# Patient Record
Sex: Female | Born: 1990 | Hispanic: No | Marital: Married | Smoking: Never smoker
Health system: Southern US, Community
[De-identification: ages and names within clinical notes are randomized; demographics above are authoritative.]

## PROBLEM LIST (undated history)

## (undated) DIAGNOSIS — E282 Polycystic ovarian syndrome: Secondary | ICD-10-CM

## (undated) DIAGNOSIS — J302 Other seasonal allergic rhinitis: Secondary | ICD-10-CM

## (undated) HISTORY — DX: Other seasonal allergic rhinitis: J30.2

## (undated) HISTORY — DX: Polycystic ovarian syndrome: E28.2

## (undated) HISTORY — PX: NO PAST SURGERIES: SHX2092

---

## 2014-12-27 ENCOUNTER — Other Ambulatory Visit (HOSPITAL_COMMUNITY)
Admission: RE | Admit: 2014-12-27 | Discharge: 2014-12-27 | Disposition: A | Discharge status: Home / Self Care | Payer: BLUE CROSS/BLUE SHIELD | Source: Ambulatory Visit | Attending: Obstetrics and Gynecology | Admitting: Obstetrics and Gynecology

## 2014-12-27 DIAGNOSIS — Z01419 Encounter for gynecological examination (general) (routine) without abnormal findings: Secondary | ICD-10-CM | POA: Diagnosis not present

## 2015-09-02 DIAGNOSIS — J3089 Other allergic rhinitis: Secondary | ICD-10-CM | POA: Insufficient documentation

## 2015-09-02 DIAGNOSIS — J302 Other seasonal allergic rhinitis: Secondary | ICD-10-CM | POA: Insufficient documentation

## 2017-05-04 ENCOUNTER — Other Ambulatory Visit: Payer: Self-pay | Admitting: Nurse Practitioner

## 2017-05-04 ENCOUNTER — Other Ambulatory Visit (HOSPITAL_COMMUNITY)
Admission: RE | Admit: 2017-05-04 | Discharge: 2017-05-04 | Disposition: A | Discharge status: Home / Self Care | Payer: BLUE CROSS/BLUE SHIELD | Source: Ambulatory Visit | Attending: Nurse Practitioner | Admitting: Nurse Practitioner

## 2017-05-04 DIAGNOSIS — Z01419 Encounter for gynecological examination (general) (routine) without abnormal findings: Secondary | ICD-10-CM | POA: Insufficient documentation

## 2017-05-06 LAB — CYTOLOGY - PAP
CHLAMYDIA, DNA PROBE: NEGATIVE
Diagnosis: NEGATIVE
Neisseria Gonorrhea: NEGATIVE

## 2018-06-14 DIAGNOSIS — E282 Polycystic ovarian syndrome: Secondary | ICD-10-CM | POA: Insufficient documentation

## 2019-04-24 DIAGNOSIS — N970 Female infertility associated with anovulation: Secondary | ICD-10-CM | POA: Insufficient documentation

## 2019-06-08 DIAGNOSIS — L309 Dermatitis, unspecified: Secondary | ICD-10-CM | POA: Insufficient documentation

## 2019-06-08 DIAGNOSIS — L219 Seborrheic dermatitis, unspecified: Secondary | ICD-10-CM | POA: Insufficient documentation

## 2019-06-30 ENCOUNTER — Ambulatory Visit: Payer: Medicaid Other | Attending: Internal Medicine

## 2019-06-30 DIAGNOSIS — Z23 Encounter for immunization: Secondary | ICD-10-CM

## 2019-06-30 NOTE — Progress Notes (Signed)
   Covid-19 Vaccination Clinic  Name:  Abigail Berry    MRN: [id number] DOB: 11-17-1990  06/30/2019  Abigail Berry was observed post Covid-19 immunization for 15 minutes without incident. She was provided with Vaccine Information Sheet and instruction to access the V-Safe system.   Abigail Berry was instructed to call 911 with any severe reactions post vaccine: Marland Kitchen Difficulty breathing  . Swelling of face and throat  . A fast heartbeat  . A bad rash all over body  . Dizziness and weakness   Immunizations Administered    Name Date Dose VIS Date Route   Pfizer COVID-19 Vaccine 06/30/2019  9:21 AM 0.3 mL 03/24/2019 Intramuscular   Manufacturer: [employer], [employer]   Lot: [id number]   NDC: [address]-6

## 2019-07-25 ENCOUNTER — Ambulatory Visit: Payer: Medicaid Other | Attending: Internal Medicine

## 2019-07-25 DIAGNOSIS — Z23 Encounter for immunization: Secondary | ICD-10-CM

## 2019-07-25 NOTE — Progress Notes (Signed)
   Covid-19 Vaccination Clinic  Name:  Abigail Berry    MRN: [id number] DOB: 1991/02/02  07/25/2019  Abigail Berry was observed post Covid-19 immunization for 15 minutes without incident. She was provided with Vaccine Information Sheet and instruction to access the V-Safe system.   Abigail Berry was instructed to call 911 with any severe reactions post vaccine: Marland Kitchen Difficulty breathing  . Swelling of face and throat  . A fast heartbeat  . A bad rash all over body  . Dizziness and weakness   Immunizations Administered    Name Date Dose VIS Date Route   Pfizer COVID-19 Vaccine 07/25/2019 11:33 AM 0.3 mL 03/24/2019 Intramuscular   Manufacturer: [employer], [employer]   Lot: [id number]   NDC: [address]-3

## 2020-05-28 LAB — OB RESULTS CONSOLE GC/CHLAMYDIA
Chlamydia: NEGATIVE
Gonorrhea: NEGATIVE

## 2020-05-28 LAB — OB RESULTS CONSOLE HIV ANTIBODY (ROUTINE TESTING): HIV: NONREACTIVE

## 2020-05-28 LAB — OB RESULTS CONSOLE RUBELLA ANTIBODY, IGM: Rubella: IMMUNE

## 2020-05-28 LAB — OB RESULTS CONSOLE ABO/RH: RH Type: NEGATIVE

## 2020-05-28 LAB — OB RESULTS CONSOLE ANTIBODY SCREEN: Antibody Screen: NEGATIVE

## 2020-05-28 LAB — OB RESULTS CONSOLE RPR: RPR: NONREACTIVE

## 2020-05-28 LAB — OB RESULTS CONSOLE HEPATITIS B SURFACE ANTIGEN: Hepatitis B Surface Ag: NEGATIVE

## 2020-06-05 ENCOUNTER — Ambulatory Visit (INDEPENDENT_AMBULATORY_CARE_PROVIDER_SITE_OTHER): Payer: Medicaid Other | Admitting: Women's Health

## 2020-06-05 ENCOUNTER — Encounter: Payer: Self-pay | Admitting: Women's Health

## 2020-06-05 ENCOUNTER — Other Ambulatory Visit (HOSPITAL_COMMUNITY)
Admission: RE | Admit: 2020-06-05 | Discharge: 2020-06-05 | Disposition: A | Discharge status: Home / Self Care | Payer: Medicaid Other | Source: Ambulatory Visit | Attending: Women's Health | Admitting: Women's Health

## 2020-06-05 ENCOUNTER — Other Ambulatory Visit: Payer: Self-pay

## 2020-06-05 VITALS — BP 132/74 | HR 99 | Ht 65.0 in | Wt 179.0 lb

## 2020-06-05 DIAGNOSIS — O26899 Other specified pregnancy related conditions, unspecified trimester: Secondary | ICD-10-CM

## 2020-06-05 DIAGNOSIS — Z3401 Encounter for supervision of normal first pregnancy, first trimester: Secondary | ICD-10-CM

## 2020-06-05 DIAGNOSIS — O99011 Anemia complicating pregnancy, first trimester: Secondary | ICD-10-CM

## 2020-06-05 DIAGNOSIS — Z6791 Unspecified blood type, Rh negative: Secondary | ICD-10-CM

## 2020-06-05 DIAGNOSIS — Z3A13 13 weeks gestation of pregnancy: Secondary | ICD-10-CM

## 2020-06-05 MED ORDER — ASPIRIN EC 81 MG PO TBEC: 81.0000 mg | DELAYED_RELEASE_TABLET | Freq: Every day | ORAL | 6 refills | Status: DC

## 2020-06-05 NOTE — Patient Instructions (Addendum)
Maternity Assessment Unit (MAU)  The Maternity Assessment Unit (MAU) is located at the [facility] at [facility]. The address is: [address] [place], [place], [place] [address]. Please see map below for additional directions.    The Maternity Assessment Unit is designed to help you during your pregnancy, and for up to 6 weeks after delivery, with any pregnancy- or postpartum-related emergencies, if you think you are in labor, or if your water has broken. For example, if you experience nausea and vomiting, vaginal bleeding, severe abdominal or pelvic pain, elevated blood pressure or other problems related to your pregnancy or postpartum time, please come to the Maternity Assessment Unit for assistance.                        Safe Medications in Pregnancy    Acne: Benzoyl Peroxide Salicylic Acid  Backache/Headache: Tylenol: 2 regular strength every 4 hours OR              2 Extra strength every 6 hours  Colds/Coughs/Allergies: Benadryl (alcohol free) 25 mg every 6 hours as needed Breath right strips Claritin Cepacol throat lozenges Chloraseptic throat spray Cold-Eeze- up to three times per day Cough drops, alcohol free Flonase (by prescription only) Guaifenesin Mucinex Robitussin DM (plain only, alcohol free) Saline nasal spray/drops Sudafed (pseudoephedrine) & Actifed ** use only after [redacted] weeks gestation and if you do not have high blood pressure Tylenol Vicks Vaporub Zinc lozenges Zyrtec   Constipation: Colace Ducolax suppositories Fleet enema Glycerin suppositories Metamucil Milk of magnesia Miralax Senokot Smooth move tea  Diarrhea: Kaopectate Imodium A-D  *NO pepto Bismol  Hemorrhoids: Anusol Anusol HC Preparation H Tucks  Indigestion: Tums Maalox Mylanta Zantac  Pepcid  Insomnia: Benadryl (alcohol free)  every 6 hours as needed Tylenol PM Unisom, no Gelcaps  Leg  Cramps: Tums MagGel  Nausea/Vomiting:  Bonine Dramamine Emetrol Ginger extract Sea bands Meclizine  Nausea medication to take during pregnancy:  Unisom (doxylamine succinate 25 mg tablets) Take one tablet daily at bedtime. If symptoms are not adequately controlled, the dose can be increased to a maximum recommended dose of two tablets daily (1/2 tablet in the morning, 1/2 tablet mid-afternoon and one at bedtime). Vitamin B6  tablets. Take one tablet twice a day (up to 200 mg per day).  Skin Rashes: Aveeno products Benadryl cream or  every 6 hours as needed Calamine Lotion 1% cortisone cream  Yeast infection: Gyne-lotrimin 7 Monistat 7   **If taking multiple medications, please check labels to avoid duplicating the same active ingredients **take medication as directed on the label ** Do not exceed 4000 mg of tylenol in 24 hours **Do not take medications that contain aspirin or ibuprofen           Second Trimester of Pregnancy  The second trimester of pregnancy is from week 13 through week 27. This is months 4 through 6 of pregnancy. The second trimester is often a time when you feel your best. Your body has adjusted to being pregnant, and you begin to feel better physically. During the second trimester:  Morning sickness has lessened or stopped completely.  You may have more energy.  You may have an increase in appetite. The second trimester is also a time when the unborn baby (fetus) is growing rapidly. At the end of the sixth month, the fetus may be up to 12 inches long and weigh about 1 pounds. You will likely begin to  feel the baby move (quickening) between 16 and 20 weeks of pregnancy. Body changes during your second trimester Your body continues to go through many changes during your second trimester. The changes vary and generally return to normal after the baby is born. Physical changes  Your weight will continue to increase. You will notice  your lower abdomen bulging out.  You may begin to get stretch marks on your hips, abdomen, and breasts.  Your breasts will continue to grow and to become tender.  Dark spots or blotches (chloasma or mask of pregnancy) may develop on your face.  A dark line from your belly button to the pubic area (linea nigra) may appear.  You may have changes in your hair. These can include thickening of your hair, rapid growth, and changes in texture. Some people also have hair loss during or after pregnancy, or hair that feels dry or thin. Health changes  You may develop headaches.  You may have heartburn.  You may develop constipation.  You may develop hemorrhoids or swollen, bulging veins (varicose veins).  Your gums may bleed and may be sensitive to brushing and flossing.  You may urinate more often because the fetus is pressing on your bladder.  You may have back pain. This is caused by: ? Weight gain. ? Pregnancy hormones that are relaxing the joints in your pelvis. ? A shift in weight and the muscles that support your balance. Follow these instructions at home: Medicines  Follow your health care provider's instructions regarding medicine use. Specific medicines may be either safe or unsafe to take during pregnancy. Do not take any medicines unless approved by your health care provider.  Take a prenatal vitamin that contains at least 600 micrograms (mcg) of folic acid. Eating and drinking  Eat a healthy diet that includes fresh fruits and vegetables, whole grains, good sources of protein such as meat, eggs, or tofu, and low-fat dairy products.  Avoid raw meat and unpasteurized juice, milk, and cheese. These carry germs that can harm you and your baby.  You may need to take these actions to prevent or treat constipation: ? Drink enough fluid to keep your urine pale yellow. ? Eat foods that are high in fiber, such as beans, whole grains, and fresh fruits and vegetables. ? Limit foods  that are high in fat and processed sugars, such as fried or sweet foods. Activity  Exercise only as directed by your health care provider. Most people can continue their usual exercise routine during pregnancy. Try to exercise for 30 minutes at least 5 days a week. Stop exercising if you develop contractions in your uterus.  Stop exercising if you develop pain or cramping in the lower abdomen or lower back.  Avoid exercising if it is very hot or humid or if you are at a high altitude.  Avoid heavy lifting.  If you choose to, you may have sex unless your health care provider tells you not to. Relieving pain and discomfort  Wear a supportive bra to prevent discomfort from breast tenderness.  Take warm sitz baths to soothe any pain or discomfort caused by hemorrhoids. Use hemorrhoid cream if your health care provider approves.  Rest with your legs raised (elevated) if you have leg cramps or low back pain.  If you develop varicose veins: ? Wear support hose as told by your health care provider. ? Elevate your feet for 15 minutes, 3-4 times a day. ? Limit salt in your diet. Safety  Wear your  seat belt at all times when driving or riding in a car.  Talk with your health care provider if someone is verbally or physically abusive to you. Lifestyle  Do not use hot tubs, steam rooms, or saunas.  Do not douche. Do not use tampons or scented sanitary pads.  Avoid cat litter boxes and soil used by cats. These carry germs that can cause birth defects in the baby and possibly loss of the fetus by miscarriage or stillbirth.  Do not use herbal remedies, alcohol, illegal drugs, or medicines that are not approved by your health care provider. Chemicals in these products can harm your baby.  Do not use any products that contain nicotine or tobacco, such as cigarettes, e-cigarettes, and chewing tobacco. If you need help quitting, ask your health care provider. General instructions  During a  routine prenatal visit, your health care provider will do a physical exam and other tests. He or she will also discuss your overall health. Keep all follow-up visits. This is important.  Ask your health care provider for a referral to a local prenatal education class.  Ask for help if you have counseling or nutritional needs during pregnancy. Your health care provider can offer advice or refer you to specialists for help with various needs. Where to find more information  American Pregnancy Association: americanpregnancy.org  [employer] of Obstetricians and Gynecologists: https://www.todd-brady.net/  Office on [employer] Health: MightyReward.co.nz Contact a health care provider if you have:  A headache that does not go away when you take medicine.  Vision changes or you see spots in front of your eyes.  Mild pelvic cramps, pelvic pressure, or nagging pain in the abdominal area.  Persistent nausea, vomiting, or diarrhea.  A bad-smelling vaginal discharge or foul-smelling urine.  Pain when you urinate.  Sudden or extreme swelling of your face, hands, ankles, feet, or legs.  A fever. Get help right away if you:  Have fluid leaking from your vagina.  Have spotting or bleeding from your vagina.  Have severe abdominal cramping or pain.  Have difficulty breathing.  Have chest pain.  Have fainting spells.  Have not felt your baby move for the time period told by your health care provider.  Have new or increased pain, swelling, or redness in an arm or leg. Summary  The second trimester of pregnancy is from week 13 through week 27 (months 4 through 6).  Do not use herbal remedies, alcohol, illegal drugs, or medicines that are not approved by your health care provider. Chemicals in these products can harm your baby.  Exercise only as directed by your health care provider. Most people can continue their usual exercise routine during pregnancy.  Keep all  follow-up visits. This is important. This information is not intended to replace advice given to you by your health care provider. Make sure you discuss any questions you have with your health care provider. Document Revised: 09/06/2019 Document Reviewed: 07/13/2019 Elsevier Patient Education  2021 Elsevier Inc.        Round Ligament Pain  The round ligament is a cord of muscle and tissue that helps support the uterus. It can become a source of pain during pregnancy if it becomes stretched or twisted as the baby grows. The pain usually begins in the second trimester (13-28 weeks) of pregnancy, and it can come and go until the baby is delivered. It is not a serious problem, and it does not cause harm to the baby. Round ligament pain is usually  a short, sharp, and pinching pain, but it can also be a dull, lingering, and aching pain. The pain is felt in the lower side of the abdomen or in the groin. It usually starts deep in the groin and moves up to the outside of the hip area. The pain may occur when you:  Suddenly change position, such as quickly going from a sitting to standing position.  Roll over in bed.  Cough or sneeze.  Do physical activity. Follow these instructions at home:  Watch your condition for any changes.  When the pain starts, relax. Then try any of these methods to help with the pain: ? Sitting down. ? Flexing your knees up to your abdomen. ? Lying on your side with one pillow under your abdomen and another pillow between your legs. ? Sitting in a warm bath for 15-20 minutes or until the pain goes away.  Take over-the-counter and prescription medicines only as told by your health care provider.  Move slowly when you sit down or stand up.  Avoid long walks if they cause pain.  Stop or reduce your physical activities if they cause pain.  Keep all follow-up visits as told by your health care provider. This is important.   Contact a health care provider  if:  Your pain does not go away with treatment.  You feel pain in your back that you did not have before.  Your medicine is not helping. Get help right away if:  You have a fever or chills.  You develop uterine contractions.  You have vaginal bleeding.  You have nausea or vomiting.  You have diarrhea.  You have pain when you urinate. Summary  Round ligament pain is felt in the lower abdomen or groin. It is usually a short, sharp, and pinching pain. It can also be a dull, lingering, and aching pain.  This pain usually begins in the second trimester (13-28 weeks). It occurs because the uterus is stretching with the growing baby, and it is not harmful to the baby.  You may notice the pain when you suddenly change position, when you cough or sneeze, or during physical activity.  Relaxing, flexing your knees to your abdomen, lying on one side, or taking a warm bath may help to get rid of the pain.  Get help from your health care provider if the pain does not go away or if you have vaginal bleeding, nausea, vomiting, diarrhea, or painful urination. This information is not intended to replace advice given to you by your health care provider. Make sure you discuss any questions you have with your health care provider. Document Revised: 09/15/2017 Document Reviewed: 09/15/2017 Elsevier Patient Education  2021 Elsevier Inc.                       Safe Medications in Pregnancy    Acne: Benzoyl Peroxide Salicylic Acid  Backache/Headache: Tylenol: 2 regular strength every 4 hours OR              2 Extra strength every 6 hours  Colds/Coughs/Allergies: Benadryl (alcohol free) 25 mg every 6 hours as needed Breath right strips Claritin Cepacol throat lozenges Chloraseptic throat spray Cold-Eeze- up to three times per day Cough drops, alcohol free Flonase (by prescription only) Guaifenesin Mucinex Robitussin DM (plain only, alcohol free) Saline nasal spray/drops Sudafed  (pseudoephedrine) & Actifed ** use only after [redacted] weeks gestation and if you do not have high blood pressure Tylenol Vicks Vaporub Zinc  lozenges Zyrtec   Constipation: Colace Ducolax suppositories Fleet enema Glycerin suppositories Metamucil Milk of magnesia Miralax Senokot Smooth move tea  Diarrhea: Kaopectate Imodium A-D  *NO pepto Bismol  Hemorrhoids: Anusol Anusol HC Preparation H Tucks  Indigestion: Tums Maalox Mylanta Zantac  Pepcid  Insomnia: Benadryl (alcohol free)  every 6 hours as needed Tylenol PM Unisom, no Gelcaps  Leg Cramps: Tums MagGel  Nausea/Vomiting:  Bonine Dramamine Emetrol Ginger extract Sea bands Meclizine  Nausea medication to take during pregnancy:  Unisom (doxylamine succinate 25 mg tablets) Take one tablet daily at bedtime. If symptoms are not adequately controlled, the dose can be increased to a maximum recommended dose of two tablets daily (1/2 tablet in the morning, 1/2 tablet mid-afternoon and one at bedtime). Vitamin B6  tablets. Take one tablet twice a day (up to 200 mg per day).  Skin Rashes: Aveeno products Benadryl cream or  every 6 hours as needed Calamine Lotion 1% cortisone cream  Yeast infection: Gyne-lotrimin 7 Monistat 7   **If taking multiple medications, please check labels to avoid duplicating the same active ingredients **take medication as directed on the label ** Do not exceed 4000 mg of tylenol in 24 hours **Do not take medications that contain aspirin or ibuprofen

## 2020-06-05 NOTE — Addendum Note (Signed)
Addended by: Marya Landry D on: 06/05/2020 11:35 AM   Modules accepted: Orders

## 2020-06-05 NOTE — Progress Notes (Signed)
History:   Abigail Berry is a 30 y.o. No obstetric history on file. at 30w4dby LMP being seen today for her first obstetrical visit.  Her obstetrical history is significant for none. Patient does intend to breast feed. Pregnancy history fully reviewed.  Pt reports this is a desired and planned pregnancy. Allergies: NKDA Current Medications: PNVs, VitD, Claritin PMH: none, pt reports hx of pre-diabetes. No HTN, DM, asthma. PSH: none OB Hx: none Social Hx: pt does not smoke, drink, or use drugs. Family Hx: Down Syndrome in father's cousin, cleft palate Pt had flu vaccine 03/2020.  Patient reports no complaints.      HISTORY: OB History  Gravida Para Term Preterm AB Living  1 0 0 0 0 0  SAB IAB Ectopic Multiple Live Births  0 0 0 0 0    # Outcome Date GA Lbr Len/2nd Weight Sex Delivery Anes PTL Lv  1 Current             Last pap smear was done 04/2017 and was normal.  Past Medical History:  Diagnosis Date  . PCOS (polycystic ovarian syndrome)   . Seasonal allergies    Past Surgical History:  Procedure Laterality Date  . NO PAST SURGERIES     Family History  Problem Relation Age of Onset  . Miscarriages / [place] Mother   . Hypertension Father   . Miscarriages / Stillbirths Maternal Aunt   . Diabetes Maternal Grandmother   . Diabetes Maternal Grandfather   . Hypertension Maternal Grandfather   . Diabetes Paternal Grandmother   . Diabetes Paternal Grandfather    Social History   Tobacco Use  . Smoking status: Never Smoker  . Smokeless tobacco: Never Used  Vaping Use  . Vaping Use: Never used  Substance Use Topics  . Alcohol use: Not Currently  . Drug use: Not Currently   Not on File Current Outpatient Medications on File Prior to Visit  Medication Sig Dispense Refill  . cholecalciferol (VITAMIN D3) 25 MCG (1000 UNIT) tablet Take 1,000 Units by mouth daily.    Marland Kitchen loratadine-pseudoephedrine (CLARITIN-D 12-HOUR) 5-120 MG tablet Take 1 tablet by mouth  2 (two) times daily.    . prenatal vitamin w/FE, FA (PRENATAL 1 + 1) 27-1 MG TABS tablet Take 1 tablet by mouth daily at 12 noon.     No current facility-administered medications on file prior to visit.    Review of Systems Pertinent items noted in HPI and remainder of comprehensive ROS otherwise negative.  Physical Exam:   Vitals:   06/05/20 0840 06/05/20 0843  BP: 132/74   Pulse: 99   Weight: 179 lb (81.2 kg)   Height:  5\' 5"  (1.651 m)     Bedside Ultrasound for FHR check: Viable intrauterine pregnancy with positive cardiac activity noted, fetal heart rate 140sbpm Patient informed that the ultrasound is considered a limited obstetric ultrasound and is not intended to be a complete ultrasound exam.  Patient also informed that the ultrasound is not being completed with the intent of assessing for fetal or placental anomalies or any pelvic abnormalities.  Explained that the purpose of today's ultrasound is to assess for fetal heart rate.  Patient acknowledges the purpose of the exam and the limitations of the study. General: well-developed, well-nourished female in no acute distress  Breasts:  normal appearance, no masses or tenderness bilaterally  Skin: normal coloration and turgor, no rashes  Neurologic: oriented, normal, negative, normal mood  Extremities: normal strength, tone, and muscle  mass, ROM of all joints is normal  HEENT PERRLA, extraocular movement intact and sclera clear, anicteric  Neck supple and no masses  Cardiovascular: regular rate and rhythm  Respiratory:  no respiratory distress, normal breath sounds  Abdomen: soft, non-tender; bowel sounds normal; no masses,  no organomegaly  Pelvic: normal external genitalia, no lesions. Pap deferred - pt reports had one last year, wil lrequest records      Assessment:    Pregnancy: No obstetric history on file. Patient Active Problem List   Diagnosis Date Noted  . Encounter for supervision of normal first pregnancy in  first trimester 06/05/2020     Plan:    1. [redacted] weeks gestation of pregnancy  2. Encounter for supervision of normal first pregnancy in first trimester - Cytology - PAP( [place]) - Cervicovaginal ancillary only( [place]) - RPR+HBsAg+HCVAb+... - Genetic Screening - Culture, OB Urine - [place] MFM OB COMP + 14 WK; Future - HgB A1c - aspirin EC 81 MG tablet; Take 1 tablet (81 mg total) by mouth daily. Swallow whole.  Dispense: 30 tablet; Refill: 6  Initial labs drawn. Continue prenatal vitamins. Problem list reviewed and updated. Genetic Screening discussed, NIPS: undecided. Ultrasound discussed; fetal anatomic survey: ordered. Anticipatory guidance about prenatal visits given including labs, ultrasounds, and testing. Discussed usage of Babyscripts and virtual visits as additional source of managing and completing prenatal visits in midst of coronavirus and pandemic.   Encouraged to complete MyChart Registration for her ability to review results, send requests, and have questions addressed.   The nature of [facility] with multiple MDs and Advanced Practice Providers was explained to patient; also emphasized that residents, students are part of our team. Routine obstetric precautions reviewed. Encouraged to seek out care at office or emergency room [facility] preferred) for urgent and/or emergent concerns. Return in about 4 weeks (around 07/03/2020) for in-person LOB/APP OK, needs records release for Pap, needs anatomy US scheduled.     Marylen Ponto, NP  9:33 AM 06/05/2020

## 2020-06-06 LAB — CBC/D/PLT+RPR+RH+ABO+RUB AB...
Antibody Screen: NEGATIVE
Basophils Absolute: 0 10*3/uL (ref 0.0–0.2)
Basos: 0 %
EOS (ABSOLUTE): 0.2 10*3/uL (ref 0.0–0.4)
Eos: 2 %
HCV Ab: <0.1 s/co ratio (ref 0.0–0.9)
HIV Screen 4th Generation wRfx: NONREACTIVE
Hematocrit: 31.4 % — ABNORMAL LOW (ref 34.0–46.6)
Hemoglobin: 10 g/dL — ABNORMAL LOW (ref 11.1–15.9)
Hepatitis B Surface Ag: NEGATIVE
Immature Grans (Abs): 0.1 10*3/uL (ref 0.0–0.1)
Immature Granulocytes: 1 %
Lymphocytes Absolute: 2 10*3/uL (ref 0.7–3.1)
Lymphs: 18 %
MCH: 23.5 pg — ABNORMAL LOW (ref 26.6–33.0)
MCHC: 31.8 g/dL (ref 31.5–35.7)
MCV: 74 fL — ABNORMAL LOW (ref 79–97)
Monocytes Absolute: 0.6 10*3/uL (ref 0.1–0.9)
Monocytes: 5 %
Neutrophils Absolute: 8.1 10*3/uL — ABNORMAL HIGH (ref 1.4–7.0)
Neutrophils: 74 %
Platelets: 334 10*3/uL (ref 150–450)
RBC: 4.25 x10E6/uL (ref 3.77–5.28)
RDW: 18.2 % — ABNORMAL HIGH (ref 11.7–15.4)
RPR Ser Ql: NONREACTIVE
Rh Factor: NEGATIVE
Rubella Antibodies, IGG: 2.32 index (ref >0.99)
WBC: 11 10*3/uL — ABNORMAL HIGH (ref 3.4–10.8)

## 2020-06-06 LAB — CERVICOVAGINAL ANCILLARY ONLY
Chlamydia: NEGATIVE
Comment: NEGATIVE
Comment: NEGATIVE
Comment: NORMAL
Neisseria Gonorrhea: NEGATIVE
Trichomonas: NEGATIVE

## 2020-06-06 LAB — HEMOGLOBIN A1C
Est. average glucose Bld gHb Est-mCnc: 108 mg/dL
Hgb A1c MFr Bld: 5.4 % (ref 4.8–5.6)

## 2020-06-06 LAB — HCV INTERPRETATION

## 2020-06-07 ENCOUNTER — Other Ambulatory Visit: Payer: Self-pay | Admitting: Women's Health

## 2020-06-07 DIAGNOSIS — O99011 Anemia complicating pregnancy, first trimester: Secondary | ICD-10-CM

## 2020-06-07 DIAGNOSIS — Z6791 Unspecified blood type, Rh negative: Secondary | ICD-10-CM | POA: Insufficient documentation

## 2020-06-07 DIAGNOSIS — O99019 Anemia complicating pregnancy, unspecified trimester: Secondary | ICD-10-CM | POA: Insufficient documentation

## 2020-06-07 DIAGNOSIS — O26899 Other specified pregnancy related conditions, unspecified trimester: Secondary | ICD-10-CM | POA: Insufficient documentation

## 2020-06-07 LAB — URINE CULTURE, OB REFLEX

## 2020-06-07 LAB — CULTURE, OB URINE

## 2020-06-07 MED ORDER — FERROUS SULFATE 325 (65 FE) MG PO TABS: 325.0000 mg | ORAL_TABLET | ORAL | 3 refills | Status: AC

## 2020-06-07 NOTE — Progress Notes (Signed)
RX iron.  Marylen Ponto, NP  11:59 AM 06/07/2020

## 2020-06-07 NOTE — Progress Notes (Signed)
Patient does not have MyChart. Please call to alert to anemia. Iron RX has been sent. Thank you!  Marylen Ponto, NP  11:58 AM 06/07/2020

## 2020-07-03 ENCOUNTER — Encounter: Payer: Medicaid Other | Admitting: Certified Nurse Midwife

## 2020-07-03 DIAGNOSIS — Z3401 Encounter for supervision of normal first pregnancy, first trimester: Secondary | ICD-10-CM

## 2020-07-15 ENCOUNTER — Ambulatory Visit: Payer: Medicaid Other | Attending: Obstetrics and Gynecology

## 2020-07-30 DIAGNOSIS — H60312 Diffuse otitis externa, left ear: Secondary | ICD-10-CM | POA: Insufficient documentation

## 2020-11-11 LAB — OB RESULTS CONSOLE GBS: GBS: NEGATIVE

## 2020-12-02 ENCOUNTER — Encounter (HOSPITAL_COMMUNITY): Payer: Self-pay | Admitting: *Deleted

## 2020-12-02 ENCOUNTER — Telehealth (HOSPITAL_COMMUNITY): Payer: Self-pay | Admitting: *Deleted

## 2020-12-02 NOTE — Telephone Encounter (Signed)
Preadmission screen  

## 2020-12-03 ENCOUNTER — Encounter (HOSPITAL_COMMUNITY): Payer: Self-pay | Admitting: *Deleted

## 2020-12-06 ENCOUNTER — Other Ambulatory Visit: Payer: Self-pay | Admitting: Obstetrics and Gynecology

## 2020-12-06 DIAGNOSIS — Z3490 Encounter for supervision of normal pregnancy, unspecified, unspecified trimester: Secondary | ICD-10-CM | POA: Diagnosis present

## 2020-12-06 NOTE — H&P (Addendum)
HPI: 30 y.o. G1P0 @ [redacted]w[redacted]d estimated gestational age (as dated by LMP c/w 9 week ultrasound) presents for induction of labor for term.  Leakage of fluid:  None Vaginal bleeding:  No Contractions:  No Fetal movement:  Yes  Prenatal care has been provided by Dr. Steva Ready [facility])  ROS:  Denies fevers, chills, chest pain, visual changes, SOB, RUQ/epigastric pain, N/V, dysuria, hematuria, or sudden onset/worsening bilateral LE or facial edema.  Pregnancy complicated by: Rh negative Pre-DM PCOS COVID-19 in early pregnancy Vitamin D Deficiency   Prenatal Transfer Tool  Maternal Diabetes: No Genetic Screening: Normal Maternal Ultrasounds/Referrals: Normal Fetal Ultrasounds or other Referrals:  None Maternal Substance Abuse:  No Significant Maternal Medications:  None Significant Maternal Lab Results: None and Group B Strep negative   Prenatal Labs Blood type:  A Negative Antibody screen:  Negative CBC:  H/H 11.2/33.7 Rubella: Immune RPR:  Non-reactive Hep B:  Negative Hep C:  Negative HIV:  Negative GC/CT:  Negative Glucola:  159.9 (elevated)  3h GTT normal  Immunizations: Tdap: Given prenatally Flu: Recieved  OBHx:  OB History     Gravida  1   Para      Term      Preterm      AB      Living         SAB      IAB      Ectopic      Multiple      Live Births             PMHx:  See above Meds:  PNV Allergy:  No Known Allergies SurgHx: None SocHx:   Denies Tobacco, ETOH, illicit drugs  O: LMP 03/02/2020  Gen. AAOx3, NAD CV.  RRR  Resp. CTAB, no wheezes/rales/rhonchi Abd. Gravid, soft, non-tender throughout, no rebound/guarding Extr.  Trace bilateral LE edema, no calf tenderness bilaterally SVE: closed/thick/high (8/26)  Last [place] (11/11/2020):  [redacted]w[redacted]d, EFW 3304g (88%), AAFV, posterior placenta (grade 3),cephalic   Labs: see orders  A/P:  30 y.o. G1P0 @ [redacted]w[redacted]d who presents for induction of labor.  IOL - term - Admit to L&D -  Admit labs (CBC, T&S, COVID screen) - CEFM/Toco - Diet:  Clear liquids - IVF:  LR at 125cc/hour - VTE Prophylaxis:  SCDs - GBS Status:  Negative - Presentation:  Confirm prior to IOL ([place] ordered) - Pain control:  Per patient request - Induction method:  Cytotec for cervical ripening - Anticipate SVD  Rh negative - Rhogam eval postpartum  Pre-DM - HgbA1C 5.1 on 06/25/20  COVID-19 in early pregnancy - Resolved  Vitamin D Deficiency - Medication: Vitamin D 2000iu daily  Steva Ready, DO [phone] (office)

## 2020-12-07 LAB — SARS CORONAVIRUS 2 (TAT 6-24 HRS): SARS Coronavirus 2: NEGATIVE

## 2020-12-09 ENCOUNTER — Inpatient Hospital Stay (HOSPITAL_BASED_OUTPATIENT_CLINIC_OR_DEPARTMENT_OTHER): Payer: Medicaid Other

## 2020-12-09 ENCOUNTER — Inpatient Hospital Stay (HOSPITAL_COMMUNITY)
Admission: AD | Admit: 2020-12-09 | Discharge: 2020-12-14 | DRG: 786 | Disposition: A | Discharge status: Home / Self Care | Payer: Medicaid Other | Attending: Obstetrics and Gynecology | Admitting: Obstetrics and Gynecology

## 2020-12-09 ENCOUNTER — Other Ambulatory Visit: Payer: Self-pay

## 2020-12-09 ENCOUNTER — Inpatient Hospital Stay (HOSPITAL_COMMUNITY): Payer: Medicaid Other

## 2020-12-09 ENCOUNTER — Encounter (HOSPITAL_COMMUNITY): Payer: Self-pay | Admitting: Obstetrics and Gynecology

## 2020-12-09 DIAGNOSIS — O2412 Pre-existing diabetes mellitus, type 2, in childbirth: Secondary | ICD-10-CM | POA: Diagnosis present

## 2020-12-09 DIAGNOSIS — O134 Gestational [pregnancy-induced] hypertension without significant proteinuria, complicating childbirth: Principal | ICD-10-CM | POA: Diagnosis present

## 2020-12-09 DIAGNOSIS — R000 Tachycardia, unspecified: Secondary | ICD-10-CM | POA: Diagnosis present

## 2020-12-09 DIAGNOSIS — O9902 Anemia complicating childbirth: Secondary | ICD-10-CM | POA: Diagnosis present

## 2020-12-09 DIAGNOSIS — D509 Iron deficiency anemia, unspecified: Secondary | ICD-10-CM | POA: Diagnosis present

## 2020-12-09 DIAGNOSIS — O99013 Anemia complicating pregnancy, third trimester: Secondary | ICD-10-CM | POA: Diagnosis not present

## 2020-12-09 DIAGNOSIS — O26893 Other specified pregnancy related conditions, third trimester: Secondary | ICD-10-CM | POA: Diagnosis present

## 2020-12-09 DIAGNOSIS — Z3A40 40 weeks gestation of pregnancy: Secondary | ICD-10-CM

## 2020-12-09 DIAGNOSIS — O411230 Chorioamnionitis, third trimester, not applicable or unspecified: Secondary | ICD-10-CM | POA: Diagnosis present

## 2020-12-09 DIAGNOSIS — O139 Gestational [pregnancy-induced] hypertension without significant proteinuria, unspecified trimester: Secondary | ICD-10-CM | POA: Diagnosis not present

## 2020-12-09 DIAGNOSIS — Z8616 Personal history of COVID-19: Secondary | ICD-10-CM | POA: Diagnosis not present

## 2020-12-09 DIAGNOSIS — Z98891 History of uterine scar from previous surgery: Secondary | ICD-10-CM

## 2020-12-09 DIAGNOSIS — Z3689 Encounter for other specified antenatal screening: Secondary | ICD-10-CM

## 2020-12-09 DIAGNOSIS — O360130 Maternal care for anti-D [Rh] antibodies, third trimester, not applicable or unspecified: Secondary | ICD-10-CM

## 2020-12-09 DIAGNOSIS — Z6791 Unspecified blood type, Rh negative: Secondary | ICD-10-CM

## 2020-12-09 DIAGNOSIS — O480 Post-term pregnancy: Secondary | ICD-10-CM | POA: Diagnosis not present

## 2020-12-09 DIAGNOSIS — O99892 Other specified diseases and conditions complicating childbirth: Secondary | ICD-10-CM | POA: Diagnosis present

## 2020-12-09 DIAGNOSIS — Z3490 Encounter for supervision of normal pregnancy, unspecified, unspecified trimester: Secondary | ICD-10-CM

## 2020-12-09 LAB — COMPREHENSIVE METABOLIC PANEL
ALT: 17 U/L (ref 0–44)
AST: 15 U/L (ref 15–41)
Albumin: 2.7 g/dL — ABNORMAL LOW (ref 3.5–5.0)
Alkaline Phosphatase: 219 U/L — ABNORMAL HIGH (ref 38–126)
Anion gap: 8 (ref 5–15)
BUN: 7 mg/dL (ref 6–20)
CO2: 20 mmol/L — ABNORMAL LOW (ref 22–32)
Calcium: 8.9 mg/dL (ref 8.9–10.3)
Chloride: 105 mmol/L (ref 98–111)
Creatinine, Ser: 0.49 mg/dL (ref 0.44–1.00)
GFR, Estimated: >60 mL/min (ref >60)
Glucose, Bld: 103 mg/dL — ABNORMAL HIGH (ref 70–99)
Potassium: 3.9 mmol/L (ref 3.5–5.1)
Sodium: 133 mmol/L — ABNORMAL LOW (ref 135–145)
Total Bilirubin: 0.2 mg/dL — ABNORMAL LOW (ref 0.3–1.2)
Total Protein: 6.7 g/dL (ref 6.5–8.1)

## 2020-12-09 LAB — PROTEIN / CREATININE RATIO, URINE
Creatinine, Urine: 125.61 mg/dL
Protein Creatinine Ratio: 0.14 mg/mg{Cre} (ref 0.00–0.15)
Total Protein, Urine: 18 mg/dL

## 2020-12-09 LAB — TYPE AND SCREEN
ABO/RH(D): A NEG
Antibody Screen: NEGATIVE

## 2020-12-09 LAB — CBC
HCT: 39.8 % (ref 36.0–46.0)
Hemoglobin: 13.2 g/dL (ref 12.0–15.0)
MCH: 28.4 pg (ref 26.0–34.0)
MCHC: 33.2 g/dL (ref 30.0–36.0)
MCV: 85.8 fL (ref 80.0–100.0)
Platelets: 284 10*3/uL (ref 150–400)
RBC: 4.64 MIL/uL (ref 3.87–5.11)
RDW: 15.5 % (ref 11.5–15.5)
WBC: 8.9 10*3/uL (ref 4.0–10.5)
nRBC: 0 % (ref 0.0–0.2)

## 2020-12-09 LAB — LACTATE DEHYDROGENASE: LDH: 103 U/L (ref 98–192)

## 2020-12-09 MED ORDER — TERBUTALINE SULFATE 1 MG/ML IJ SOLN
0.2500 mg | Freq: Once | INTRAMUSCULAR | Status: DC | PRN
Start: 2020-12-09 — End: 2020-12-12

## 2020-12-09 MED ORDER — OXYTOCIN-SODIUM CHLORIDE 30-0.9 UT/500ML-% IV SOLN: 1.0000 m[IU]/min | INTRAVENOUS | Status: DC

## 2020-12-09 MED ORDER — OXYTOCIN-SODIUM CHLORIDE 30-0.9 UT/500ML-% IV SOLN: 2.5000 [IU]/h | INTRAVENOUS | Status: DC

## 2020-12-09 MED ORDER — ACETAMINOPHEN 325 MG PO TABS: 650.0000 mg | ORAL_TABLET | ORAL | Status: DC | PRN

## 2020-12-09 MED ORDER — MISOPROSTOL 25 MCG QUARTER TABLET
25.0000 ug | ORAL_TABLET | ORAL | Status: DC | PRN
  Administered 2020-12-09 – 2020-12-10 (×4): 25 ug via VAGINAL
  Filled 2020-12-09 (×4): qty 1

## 2020-12-09 MED ORDER — OXYTOCIN BOLUS FROM INFUSION: 333.0000 mL | Freq: Once | INTRAVENOUS | Status: DC

## 2020-12-09 MED ORDER — FENTANYL CITRATE (PF) 100 MCG/2ML IJ SOLN
50.0000 ug | INTRAMUSCULAR | Status: DC | PRN
  Administered 2020-12-10: 50 ug via INTRAVENOUS
  Administered 2020-12-11 (×4): 100 ug via INTRAVENOUS
  Filled 2020-12-09 (×6): qty 2

## 2020-12-09 MED ORDER — LACTATED RINGERS IV SOLN: INTRAVENOUS | Status: DC

## 2020-12-09 MED ORDER — LIDOCAINE HCL (PF) 1 % IJ SOLN: 30.0000 mL | INTRAMUSCULAR | Status: DC | PRN

## 2020-12-09 MED ORDER — OXYCODONE-ACETAMINOPHEN 5-325 MG PO TABS: 1.0000 | ORAL_TABLET | ORAL | Status: DC | PRN

## 2020-12-09 MED ORDER — ONDANSETRON HCL 4 MG/2ML IJ SOLN
4.0000 mg | Freq: Four times a day (QID) | INTRAMUSCULAR | Status: DC | PRN
  Administered 2020-12-11: 4 mg via INTRAVENOUS
  Filled 2020-12-09: qty 2

## 2020-12-09 MED ORDER — SOD CITRATE-CITRIC ACID 500-334 MG/5ML PO SOLN
30.0000 mL | ORAL | Status: DC | PRN
  Administered 2020-12-12: 30 mL via ORAL
  Filled 2020-12-09: qty 30

## 2020-12-09 MED ORDER — OXYCODONE-ACETAMINOPHEN 5-325 MG PO TABS: 2.0000 | ORAL_TABLET | ORAL | Status: DC | PRN

## 2020-12-09 MED ORDER — LACTATED RINGERS IV SOLN
500.0000 mL | INTRAVENOUS | Status: DC | PRN
  Administered 2020-12-10 – 2020-12-11 (×2): 500 mL via INTRAVENOUS
  Administered 2020-12-12: 1000 mL via INTRAVENOUS

## 2020-12-09 NOTE — Progress Notes (Addendum)
OB Progress Note  S: Patient resting comfortably. She just received her second Cytotec.  O: BP (!) 104/45   Pulse 87   Temp 98.1 F (36.7 C) (Oral)   LMP 03/02/2020   FHT: 155bpm, moderate variablity, + accels, = decels Toco: NONE SVE: Deferred, closed/thick/high (per primary RN at ~1700)  A/P: 30 y.o. G1P0 @ [redacted]w[redacted]d admitted for induction of labor for term.  FWB: Cat. I Labor course: S/p Cyototec x 2, continue cervical ripening Pain: Per patient request GBS: Negative Anticipate SVD  Steva Ready, DO

## 2020-12-10 LAB — RPR: RPR Ser Ql: NONREACTIVE

## 2020-12-10 MED ORDER — LABETALOL HCL 5 MG/ML IV SOLN: 20.0000 mg | INTRAVENOUS | Status: DC | PRN

## 2020-12-10 MED ORDER — LABETALOL HCL 5 MG/ML IV SOLN: 40.0000 mg | INTRAVENOUS | Status: DC | PRN

## 2020-12-10 MED ORDER — HYDRALAZINE HCL 20 MG/ML IJ SOLN: 10.0000 mg | INTRAMUSCULAR | Status: DC | PRN

## 2020-12-10 MED ORDER — OXYTOCIN-SODIUM CHLORIDE 30-0.9 UT/500ML-% IV SOLN
1.0000 m[IU]/min | INTRAVENOUS | Status: DC
  Administered 2020-12-10: 2 m[IU]/min via INTRAVENOUS
  Administered 2020-12-11: 18 m[IU]/min via INTRAVENOUS
  Filled 2020-12-10 (×2): qty 500

## 2020-12-10 MED ORDER — MISOPROSTOL 50MCG HALF TABLET
50.0000 ug | ORAL_TABLET | ORAL | Status: DC
  Administered 2020-12-10 (×3): 50 ug via BUCCAL
  Filled 2020-12-10 (×4): qty 1

## 2020-12-10 MED ORDER — LABETALOL HCL 5 MG/ML IV SOLN: 80.0000 mg | INTRAVENOUS | Status: DC | PRN

## 2020-12-10 MED ORDER — NIFEDIPINE ER OSMOTIC RELEASE 30 MG PO TB24
30.0000 mg | ORAL_TABLET | Freq: Every day | ORAL | Status: DC
  Administered 2020-12-10: 30 mg via ORAL
  Filled 2020-12-10 (×3): qty 1

## 2020-12-10 NOTE — Progress Notes (Signed)
OB Progress Note  S: Patient resting comfortably. Desires light meal. Consents to FB placement.  O: BP (!) 148/96   Pulse 90   Temp 97.9 F (36.6 C) (Oral)   Resp 16   LMP 03/02/2020   FHT: 155bpm, moderate variablity, + accels, - decels Toco: q1-3 minutes SVE: 0.5/thick/high  A/P: 30 y.o. G1P0 @ [redacted]w[redacted]d admitted for induction of labor for term.  FWB: Cat. I Labor course: S/p vaginal Cytotec x 4 doses, will do Cytotec buccal for next dose for continued ripening. Unable to place FB as it continued to expel when balloon was filled. Pain: Per patient request GBS: Negative Anticipate SVD  Steva Ready, DO

## 2020-12-10 NOTE — Progress Notes (Signed)
Subjective:    CNM to bedside for introduction. Pt denies HA, visual changes, and RUQ pain. Tolerating Procardia w/o complaints. Plans epidural for pain management. Discussed POC.   Objective:    VS: BP (!) 147/79   Pulse 86   Temp 97.8 F (36.6 C) (Oral)   Resp 17   LMP 03/02/2020  FHR : baseline 150 / variability moderate / accelerations present / absent decelerations Toco: contractions every 2-6 minutes  Membranes: intact Dilation: 2.5 Effacement (%): 50 Cervical Position: Posterior Station: -3 Presentation: Vertex Exam by:: Dr. Connye Burkitt  Assessment/Plan:   29 y.o. G1P0 [redacted]w[redacted]d IOL @ term GHTN    -started Procardia XL 30 mg PO daily    -mild range BPs persist    -neg neuro symptoms  Labor:  S/p Cytotec x 4 doses vaginally and 3 doses buccally, will start Pitocin   Fetal Wellbeing:  Category I Pain Control:   planning epidural I/D:   neg Anticipated MOD:  NSVD  Roma Schanz MSN, CNM 12/10/2020 8:24 PM

## 2020-12-10 NOTE — Progress Notes (Signed)
OB Progress Note  S: Patient resting comfortably. Feeling some cramping.  O: BP (!) 145/75   Pulse 90   Temp 98.4 F (36.9 C) (Oral)   Resp 16   LMP 03/02/2020   FHT: 150bpm, moderate variablity, + accels, - decels Toco: q1-3 minutes SVE: 2.5-3/50/high, cervix posterior  A/P: 30 y.o. G1P0 @ [redacted]w[redacted]d admitted for induction of labor at term.  IOL - term FWB: Cat. I Labor course: S/p Cytotec x 4 doses vaginally and 3 doses buccally, start pitocin ~2100 Pain: Per patient request GBS: Negative Anticipate SVD  Gestational HTN - Persistent mild range BPS - Procardia XL 30mg  daily ordered - Preeclampsia labs negative, P/C ratio 0.14  , DO

## 2020-12-11 ENCOUNTER — Inpatient Hospital Stay (HOSPITAL_COMMUNITY): Payer: Medicaid Other | Admitting: Anesthesiology

## 2020-12-11 MED ORDER — FENTANYL-BUPIVACAINE-NACL 0.5-0.125-0.9 MG/250ML-% EP SOLN
12.0000 mL/h | EPIDURAL | Status: DC | PRN
  Administered 2020-12-11 – 2020-12-12 (×2): 12 mL/h via EPIDURAL
  Filled 2020-12-11 (×2): qty 250

## 2020-12-11 MED ORDER — AMPICILLIN-SULBACTAM SODIUM 3 (2-1) G IJ SOLR
3.0000 g | Freq: Four times a day (QID) | INTRAMUSCULAR | Status: DC
Start: 2020-12-11 — End: 2020-12-12
  Administered 2020-12-11 – 2020-12-12 (×2): 3 g via INTRAVENOUS
  Filled 2020-12-11 (×3): qty 8

## 2020-12-11 MED ORDER — PHENYLEPHRINE 40 MCG/ML (10ML) SYRINGE FOR IV PUSH (FOR BLOOD PRESSURE SUPPORT): 80.0000 ug | PREFILLED_SYRINGE | INTRAVENOUS | Status: DC | PRN

## 2020-12-11 MED ORDER — EPHEDRINE 5 MG/ML INJ: 10.0000 mg | INTRAVENOUS | Status: DC | PRN

## 2020-12-11 MED ORDER — LIDOCAINE HCL (PF) 1 % IJ SOLN
INTRAMUSCULAR | Status: DC | PRN
  Administered 2020-12-11 (×2): 4 mL via EPIDURAL
  Administered 2020-12-12: 5 mL via EPIDURAL
  Administered 2020-12-12: 4 mL via EPIDURAL

## 2020-12-11 MED ORDER — LACTATED RINGERS IV SOLN
500.0000 mL | Freq: Once | INTRAVENOUS | Status: AC
  Administered 2020-12-11: 500 mL via INTRAVENOUS

## 2020-12-11 MED ORDER — ACETAMINOPHEN 500 MG PO TABS
1000.0000 mg | ORAL_TABLET | Freq: Four times a day (QID) | ORAL | Status: DC | PRN
  Administered 2020-12-11 – 2020-12-12 (×2): 1000 mg via ORAL
  Filled 2020-12-11 (×3): qty 2

## 2020-12-11 MED ORDER — FENTANYL-BUPIVACAINE-NACL 0.5-0.125-0.9 MG/250ML-% EP SOLN
EPIDURAL | Status: AC
  Filled 2020-12-11: qty 250

## 2020-12-11 MED ORDER — LACTATED RINGERS IV SOLN: 500.0000 mL | Freq: Once | INTRAVENOUS | Status: DC

## 2020-12-11 MED ORDER — BUPIVACAINE HCL (PF) 0.25 % IJ SOLN
INTRAMUSCULAR | Status: DC | PRN
  Administered 2020-12-11 (×4): 5 mL via EPIDURAL

## 2020-12-11 MED ORDER — DIPHENHYDRAMINE HCL 50 MG/ML IJ SOLN: 12.5000 mg | INTRAMUSCULAR | Status: DC | PRN

## 2020-12-11 NOTE — Anesthesia Procedure Notes (Signed)
Epidural Patient location during procedure: OB Start time: 12/11/2020 3:29 PM End time: 12/11/2020 3:33 PM  Staffing Anesthesiologist: Kaylyn Layer, MD Performed: anesthesiologist   Preanesthetic Checklist Completed: patient identified, IV checked, risks and benefits discussed, monitors and equipment checked, pre-op evaluation and timeout performed  Epidural Patient position: sitting Prep: DuraPrep and site prepped and draped Patient monitoring: continuous pulse ox, blood pressure and heart rate Approach: midline Location: L3-L4 Injection technique: LOR air  Needle:  Needle type: Tuohy  Needle gauge: 17 G Needle length: 9 cm Needle insertion depth: 7 cm Catheter type: closed end flexible Catheter size: 19 Gauge Catheter at skin depth: 12 cm Test dose: negative and Other (1% lidocaine)  Assessment Events: blood not aspirated, injection not painful, no injection resistance, no paresthesia and negative IV test  Additional Notes Patient identified. Risks, benefits, and alternatives discussed with patient including but not limited to bleeding, infection, nerve damage, paralysis, failed block, incomplete pain control, headache, blood pressure changes, nausea, vomiting, reactions to medication, itching, and postpartum back pain. Confirmed with bedside nurse the patient's most recent platelet count. Confirmed with patient that they are not currently taking any anticoagulation, have any bleeding history, or any family history of bleeding disorders. Patient expressed understanding and wished to proceed. All questions were answered. Sterile technique was used throughout the entire procedure. Please see nursing notes for vital signs.   Crisp LOR after 2 needle redirections. Test dose was given through epidural catheter and negative prior to continuing to dose epidural or start infusion. Warning signs of high block given to the patient including shortness of breath, tingling/numbness in  hands, complete motor block, or any concerning symptoms with instructions to call for help. Patient was given instructions on fall risk and not to get out of bed. All questions and concerns addressed with instructions to call with any issues or inadequate analgesia.  Reason for block:procedure for pain

## 2020-12-11 NOTE — Progress Notes (Signed)
OB Progress Note  S: Patient having pain with contractions with epidural in place. Reports low back pain as well.  O: BP (!) 148/78   Pulse 94   Temp 97.8 F (36.6 C) (Oral)   Resp 18   LMP 03/02/2020   FHT: 155bpm, moderate variablity, + accels, - decels Toco: q1-2 minutes, MVUs ~180 SVE: 3.5/50/high, cervix mid-position now, significant discomfort with cervical exam  Bedside US: Cephalic, fetal head position LOA  A/P: 30 y.o. G1P0 @ [redacted]w[redacted]d admitted for induction of labor at term.  FWB: Cat. I Labor course: S/p Cytotec x 4 doses vaginally and 3 doses buccally, Pitocin currently at 69mU/min, increased to 26mU/min, s/p AROM at 1255 Pain: Epidural in place - will place in high Fowler's position to allow time for epidural to work, if unsuccessful will need re-evaluation by anesthesiology GBS: Negative Anticipate SVD  Steva Ready, DO

## 2020-12-11 NOTE — Progress Notes (Signed)
OB Progress Note  S: Patient resting comfortably. She is not yet ready for epidural. Consents to AROM and IUPC placement.  O: BP 138/68   Pulse 68   Temp 98.8 F (37.1 C) (Oral)   Resp 17   LMP 03/02/2020   FHT: 155bpm, minimal variablity, - accels, - decels, positive scalp stim Toco: q1-2 minutes SVE: 3/50/high, cervix very posterior to the left  A/P: 30 y.o. G1P0 @ [redacted]w[redacted]d admitted for induction of labor at term.  FWB: Cat. I Labor course: S/p Cytotec x 4 doses vaginally and 3 doses buccally, Pitocin currently at 36mU/min, increased to 28mU/min now. Unable to AROM due to very posterior cervix. Continue titrating pitocin. Will re-attempt AROM/IUPC placement ~1200. Pain: Per patient request GBS: Negative Anticipate SVD  Steva Ready, DO

## 2020-12-11 NOTE — Progress Notes (Addendum)
Subjective:    Resting.   Objective:    VS: BP 119/70   Pulse 83   Temp 98 F (36.7 C) (Oral)   Resp 17   LMP 03/02/2020  FHR : baseline 155 / variability moderate / accelerations present / absent decelerations Toco: contractions every 2-3 minutes  Membranes: intact Dilation: 3 Effacement (%): 70 Cervical Position: to maternal left Station: -3 Presentation: Vertex Exam by:: Rhea Pink CNM Pitocin 4 mU/min  Assessment/Plan:   30 y.o. G1P0 [redacted]w[redacted]d IOL @ term GHTN  Labor: S/p Cytotec x 4 doses vaginally and 3 doses buccally, now on Pitocin. Unable to AROM due to position of cervix Fetal Wellbeing:  Category I Pain Control:   plans epidural I/D:   GBS neg Anticipated MOD:  NSVD  Roma Schanz MSN, CNM 12/11/2020 3:44 AM

## 2020-12-11 NOTE — Anesthesia Preprocedure Evaluation (Signed)
Anesthesia Evaluation  Patient identified by MRN, date of birth, ID band Patient awake    Reviewed: Allergy & Precautions, Patient's Chart, lab work & pertinent test results  History of Anesthesia Complications Negative for: history of anesthetic complications  Airway Mallampati: II  TM Distance: >3 FB Neck ROM: Full    Dental no notable dental hx.    Pulmonary neg pulmonary ROS,    Pulmonary exam normal        Cardiovascular negative cardio ROS Normal cardiovascular exam     Neuro/Psych negative neurological ROS  negative psych ROS   GI/Hepatic negative GI ROS, Neg liver ROS,   Endo/Other  negative endocrine ROS  Renal/GU negative Renal ROS  negative genitourinary   Musculoskeletal negative musculoskeletal ROS (+)   Abdominal   Peds  Hematology negative hematology ROS (+)   Anesthesia Other Findings Day of surgery medications reviewed with patient.  Reproductive/Obstetrics (+) Pregnancy                             Anesthesia Physical Anesthesia Plan  ASA: 2  Anesthesia Plan: Epidural   Post-op Pain Management:    Induction:   PONV Risk Score and Plan: Treatment may vary due to age or medical condition  Airway Management Planned: Natural Airway  Additional Equipment:   Intra-op Plan:   Post-operative Plan:   Informed Consent: I have reviewed the patients History and Physical, chart, labs and discussed the procedure including the risks, benefits and alternatives for the proposed anesthesia with the patient or authorized representative who has indicated his/her understanding and acceptance.       Plan Discussed with:   Anesthesia Plan Comments:         Anesthesia Quick Evaluation  

## 2020-12-11 NOTE — Progress Notes (Signed)
OB Progress Note  S: Patient resting comfortably. She is not yet ready for epidural. Consents to AROM and IUPC placement.  O: BP 137/82   Pulse 78   Temp 97.9 F (36.6 C) (Oral)   Resp 18   LMP 03/02/2020   FHT: 160bpm, minimal variablity, + accels, - decel Toco: q1-2 minutes SVE: 3.5/50/high but fetal head engaged AROM: Clear, non-odorous, IUPC placed  A/P: 30 y.o. G1P0 @ [redacted]w[redacted]d admitted for induction of labor at term.  FWB: Cat. I Labor course: S/p Cytotec x 4 doses vaginally and 3 doses buccally, Pitocin currently at 20mU/min, increased to 16mU/min, s/p AROM now with IUPC placement. Pain: Per patient request GBS: Negative Anticipate SVD  Steva Ready, DO

## 2020-12-11 NOTE — Progress Notes (Signed)
Subjective:    Pt uncomfortable with ctxs. SVE 5/70/-2. Small clot noted. Light mec noted. IUPC replaced. FHT reviewed.  Ctxs 2-3 min. Abd soft between ctxs.   Objective:    VS: BP (!) 161/83   Pulse (!) 103   Temp 99.9 F (37.7 C) (Axillary)   Resp 14   Ht 5\' 5"  (1.651 m)   Wt 93.8 kg   LMP 03/02/2020   BMI 34.43 kg/m  FHR : baseline 165 / variability moderate / accelerations present / absent decelerations Toco: contractions every 2-3 minutes Membranes: light mec noted Dilation: 5 Effacement (%): 70 Cervical Position: Middle Station: -2 Presentation: Vertex Exam by:: [email or web address], CNM Pitocin 22 mU/min  Assessment/Plan:   30 y.o. G1P0 [redacted]w[redacted]d IOL at term  Labor:  Cytotec x 4 vaginally, 3 doses buccally, Pitocin at 58mu now. AROM at 1255- clear/  Light mec noted now Starting to make cervical change, now 5/70/-2 Pitocin decreased to 80mu. Ctx 2-3 min. Abd soft. Tachycardia noted 165bpm - Temp 99.9. Tylenol 1000mg  given. 9m IVB given. C/o ctx pain. Epidural bolus given. Anesthesia to evaluate if no improvement. Preeclampsia:  no signs or symptoms of toxicity Fetal Wellbeing:  Category II d/t tachycardia Pain Control:  Epidural I/D:   Negative Anticipated MOD:  NSVD Dr notified of pt status and collaborated with POC  MSN, CNM 12/11/2020 9:25 PM

## 2020-12-11 NOTE — Progress Notes (Signed)
Pt very uncomfortable with cervical exam.  RN very gentle with check but pt felt most discomfort when actually touching the edges of cervix & states that it hurst & causes her to cramp really bad in lower abdomen like a shooting pain.  Pt feels warm with SVE.  Chart notes clear fluid with ROM but pad has some greenish fluid on it.  Pt has more bloody show than normal for minimal change and several small clots out when RN removed hand.  IUPC flushed & zeroed.

## 2020-12-12 ENCOUNTER — Encounter (HOSPITAL_COMMUNITY)
Admission: AD | Disposition: A | Discharge status: Home / Self Care | Payer: Self-pay | Source: Home / Self Care | Attending: Obstetrics and Gynecology

## 2020-12-12 ENCOUNTER — Encounter (HOSPITAL_COMMUNITY): Payer: Self-pay | Admitting: Obstetrics and Gynecology

## 2020-12-12 SURGERY — Surgical Case
Anesthesia: Epidural | Wound class: Clean Contaminated

## 2020-12-12 MED ORDER — IBUPROFEN 600 MG PO TABS
600.0000 mg | ORAL_TABLET | Freq: Four times a day (QID) | ORAL | Status: DC
  Administered 2020-12-13 – 2020-12-14 (×5): 600 mg via ORAL
  Filled 2020-12-12 (×5): qty 1

## 2020-12-12 MED ORDER — MEPERIDINE HCL 25 MG/ML IJ SOLN: 6.2500 mg | INTRAMUSCULAR | Status: DC | PRN

## 2020-12-12 MED ORDER — ZOLPIDEM TARTRATE 5 MG PO TABS: 5.0000 mg | ORAL_TABLET | Freq: Every evening | ORAL | Status: DC | PRN

## 2020-12-12 MED ORDER — DEXAMETHASONE SODIUM PHOSPHATE 10 MG/ML IJ SOLN
INTRAMUSCULAR | Status: DC | PRN
  Administered 2020-12-12: 10 mg via INTRAVENOUS

## 2020-12-12 MED ORDER — SCOPOLAMINE 1 MG/3DAYS TD PT72
1.0000 | MEDICATED_PATCH | Freq: Once | TRANSDERMAL | Status: DC
  Administered 2020-12-12: 1.5 mg via TRANSDERMAL
  Filled 2020-12-12: qty 1

## 2020-12-12 MED ORDER — DIPHENHYDRAMINE HCL 25 MG PO CAPS: 25.0000 mg | ORAL_CAPSULE | Freq: Four times a day (QID) | ORAL | Status: DC | PRN

## 2020-12-12 MED ORDER — MIDAZOLAM HCL 2 MG/2ML IJ SOLN
INTRAMUSCULAR | Status: DC | PRN
  Administered 2020-12-12 (×2): 2 mg via INTRAVENOUS

## 2020-12-12 MED ORDER — OXYCODONE HCL 5 MG PO TABS: 5.0000 mg | ORAL_TABLET | ORAL | Status: DC | PRN

## 2020-12-12 MED ORDER — SODIUM CHLORIDE 0.9% FLUSH: 3.0000 mL | INTRAVENOUS | Status: DC | PRN

## 2020-12-12 MED ORDER — CEFAZOLIN SODIUM-DEXTROSE 2-4 GM/100ML-% IV SOLN: 2.0000 g | INTRAVENOUS | Status: DC

## 2020-12-12 MED ORDER — MORPHINE SULFATE (PF) 2 MG/ML IV SOLN: 1.0000 mg | INTRAVENOUS | Status: DC | PRN

## 2020-12-12 MED ORDER — FENTANYL CITRATE (PF) 100 MCG/2ML IJ SOLN
INTRAMUSCULAR | Status: AC
  Filled 2020-12-12: qty 2

## 2020-12-12 MED ORDER — DIPHENHYDRAMINE HCL 50 MG/ML IJ SOLN: 12.5000 mg | INTRAMUSCULAR | Status: DC | PRN

## 2020-12-12 MED ORDER — TRANEXAMIC ACID-NACL 1000-0.7 MG/100ML-% IV SOLN
INTRAVENOUS | Status: DC | PRN
  Administered 2020-12-12: 1000 mg via INTRAVENOUS

## 2020-12-12 MED ORDER — ACETAMINOPHEN 500 MG PO TABS
1000.0000 mg | ORAL_TABLET | Freq: Four times a day (QID) | ORAL | Status: AC
Start: 2020-12-12 — End: 2020-12-13
  Administered 2020-12-12 – 2020-12-13 (×4): 1000 mg via ORAL
  Filled 2020-12-12 (×4): qty 2

## 2020-12-12 MED ORDER — NIFEDIPINE ER OSMOTIC RELEASE 30 MG PO TB24
30.0000 mg | ORAL_TABLET | Freq: Every day | ORAL | Status: DC
  Administered 2020-12-12 – 2020-12-14 (×3): 30 mg via ORAL
  Filled 2020-12-12 (×3): qty 1

## 2020-12-12 MED ORDER — SIMETHICONE 80 MG PO CHEW: 80.0000 mg | CHEWABLE_TABLET | ORAL | Status: DC | PRN

## 2020-12-12 MED ORDER — COCONUT OIL OIL: 1.0000 "application " | TOPICAL_OIL | Status: DC | PRN

## 2020-12-12 MED ORDER — MIDAZOLAM HCL 2 MG/2ML IJ SOLN
INTRAMUSCULAR | Status: AC
  Filled 2020-12-12: qty 2

## 2020-12-12 MED ORDER — NALOXONE HCL 0.4 MG/ML IJ SOLN: 0.4000 mg | INTRAMUSCULAR | Status: DC | PRN

## 2020-12-12 MED ORDER — FENTANYL CITRATE (PF) 250 MCG/5ML IJ SOLN
INTRAMUSCULAR | Status: DC | PRN
  Administered 2020-12-12: 50 ug via INTRAVENOUS
  Administered 2020-12-12 (×2): 100 ug via INTRAVENOUS

## 2020-12-12 MED ORDER — OXYTOCIN-SODIUM CHLORIDE 30-0.9 UT/500ML-% IV SOLN: 2.5000 [IU]/h | INTRAVENOUS | Status: AC

## 2020-12-12 MED ORDER — SODIUM CHLORIDE 0.9 % IV SOLN
3.0000 g | Freq: Four times a day (QID) | INTRAVENOUS | Status: AC
  Administered 2020-12-12 – 2020-12-13 (×4): 3 g via INTRAVENOUS
  Filled 2020-12-12 (×5): qty 8

## 2020-12-12 MED ORDER — OXYTOCIN-SODIUM CHLORIDE 30-0.9 UT/500ML-% IV SOLN
INTRAVENOUS | Status: AC
  Filled 2020-12-12: qty 500

## 2020-12-12 MED ORDER — ONDANSETRON HCL 4 MG/2ML IJ SOLN
INTRAMUSCULAR | Status: AC
  Filled 2020-12-12: qty 2

## 2020-12-12 MED ORDER — CEFAZOLIN SODIUM-DEXTROSE 2-3 GM-%(50ML) IV SOLR
INTRAVENOUS | Status: DC | PRN
  Administered 2020-12-12: 2 g via INTRAVENOUS

## 2020-12-12 MED ORDER — DIPHENOXYLATE-ATROPINE 2.5-0.025 MG PO TABS: 2.0000 | ORAL_TABLET | Freq: Four times a day (QID) | ORAL | Status: DC | PRN

## 2020-12-12 MED ORDER — NALBUPHINE HCL 10 MG/ML IJ SOLN: 5.0000 mg | INTRAMUSCULAR | Status: DC | PRN

## 2020-12-12 MED ORDER — SENNOSIDES-DOCUSATE SODIUM 8.6-50 MG PO TABS
2.0000 | ORAL_TABLET | Freq: Every day | ORAL | Status: DC
  Administered 2020-12-13 – 2020-12-14 (×2): 2 via ORAL
  Filled 2020-12-12 (×2): qty 2

## 2020-12-12 MED ORDER — FENTANYL CITRATE (PF) 100 MCG/2ML IJ SOLN
INTRAMUSCULAR | Status: DC | PRN
  Administered 2020-12-12: 100 ug via EPIDURAL

## 2020-12-12 MED ORDER — WITCH HAZEL-GLYCERIN EX PADS: 1.0000 "application " | MEDICATED_PAD | CUTANEOUS | Status: DC | PRN

## 2020-12-12 MED ORDER — DIBUCAINE (PERIANAL) 1 % EX OINT: 1.0000 "application " | TOPICAL_OINTMENT | CUTANEOUS | Status: DC | PRN

## 2020-12-12 MED ORDER — NALBUPHINE HCL 10 MG/ML IJ SOLN: 5.0000 mg | Freq: Once | INTRAMUSCULAR | Status: DC | PRN

## 2020-12-12 MED ORDER — ACETAMINOPHEN 500 MG PO TABS: 1000.0000 mg | ORAL_TABLET | Freq: Four times a day (QID) | ORAL | Status: DC | PRN

## 2020-12-12 MED ORDER — OXYTOCIN-SODIUM CHLORIDE 30-0.9 UT/500ML-% IV SOLN
INTRAVENOUS | Status: DC | PRN
  Administered 2020-12-12 (×2): 30 [IU] via INTRAVENOUS

## 2020-12-12 MED ORDER — ONDANSETRON HCL 4 MG/2ML IJ SOLN
INTRAMUSCULAR | Status: DC | PRN
  Administered 2020-12-12: 4 mg via INTRAVENOUS

## 2020-12-12 MED ORDER — ONDANSETRON HCL 4 MG/2ML IJ SOLN: 4.0000 mg | Freq: Three times a day (TID) | INTRAMUSCULAR | Status: DC | PRN

## 2020-12-12 MED ORDER — PROMETHAZINE HCL 25 MG/ML IJ SOLN: 6.2500 mg | INTRAMUSCULAR | Status: DC | PRN

## 2020-12-12 MED ORDER — LACTATED RINGERS IV SOLN: INTRAVENOUS | Status: DC

## 2020-12-12 MED ORDER — KETOROLAC TROMETHAMINE 30 MG/ML IJ SOLN
INTRAMUSCULAR | Status: AC
  Filled 2020-12-12: qty 1

## 2020-12-12 MED ORDER — NALOXONE HCL 4 MG/10ML IJ SOLN
1.0000 ug/kg/h | INTRAVENOUS | Status: DC | PRN
  Filled 2020-12-12: qty 5

## 2020-12-12 MED ORDER — DIPHENHYDRAMINE HCL 25 MG PO CAPS: 25.0000 mg | ORAL_CAPSULE | ORAL | Status: DC | PRN

## 2020-12-12 MED ORDER — MORPHINE SULFATE (PF) 0.5 MG/ML IJ SOLN
INTRAMUSCULAR | Status: DC | PRN
  Administered 2020-12-12: 2 mg via EPIDURAL
  Administered 2020-12-12: 3 mg via EPIDURAL

## 2020-12-12 MED ORDER — KETOROLAC TROMETHAMINE 30 MG/ML IJ SOLN: 30.0000 mg | Freq: Four times a day (QID) | INTRAMUSCULAR | Status: DC | PRN

## 2020-12-12 MED ORDER — PHENYLEPHRINE 40 MCG/ML (10ML) SYRINGE FOR IV PUSH (FOR BLOOD PRESSURE SUPPORT)
PREFILLED_SYRINGE | INTRAVENOUS | Status: DC | PRN
  Administered 2020-12-12: 80 ug via INTRAVENOUS

## 2020-12-12 MED ORDER — MORPHINE SULFATE (PF) 0.5 MG/ML IJ SOLN
INTRAMUSCULAR | Status: AC
  Filled 2020-12-12: qty 10

## 2020-12-12 MED ORDER — DIPHENOXYLATE-ATROPINE 2.5-0.025 MG PO TABS
ORAL_TABLET | ORAL | Status: AC
  Filled 2020-12-12: qty 2

## 2020-12-12 MED ORDER — LIDOCAINE-EPINEPHRINE (PF) 2 %-1:200000 IJ SOLN
INTRAMUSCULAR | Status: DC | PRN
  Administered 2020-12-12: 5 mL via EPIDURAL
  Administered 2020-12-12: 3 mL via EPIDURAL
  Administered 2020-12-12: 10 mL via EPIDURAL
  Administered 2020-12-12: 2 mL via EPIDURAL

## 2020-12-12 MED ORDER — KETOROLAC TROMETHAMINE 30 MG/ML IJ SOLN
30.0000 mg | Freq: Four times a day (QID) | INTRAMUSCULAR | Status: DC | PRN
  Administered 2020-12-12: 30 mg via INTRAMUSCULAR

## 2020-12-12 MED ORDER — CARBOPROST TROMETHAMINE 250 MCG/ML IM SOLN
INTRAMUSCULAR | Status: DC | PRN
  Administered 2020-12-12: 250 ug via INTRAMUSCULAR

## 2020-12-12 MED ORDER — HYDROMORPHONE HCL 1 MG/ML IJ SOLN: 0.2500 mg | INTRAMUSCULAR | Status: DC | PRN

## 2020-12-12 MED ORDER — KETOROLAC TROMETHAMINE 30 MG/ML IJ SOLN
30.0000 mg | Freq: Four times a day (QID) | INTRAMUSCULAR | Status: AC
Start: 2020-12-12 — End: 2020-12-13
  Administered 2020-12-12 – 2020-12-13 (×3): 30 mg via INTRAVENOUS
  Filled 2020-12-12 (×3): qty 1

## 2020-12-12 MED ORDER — DEXAMETHASONE SODIUM PHOSPHATE 10 MG/ML IJ SOLN
INTRAMUSCULAR | Status: AC
  Filled 2020-12-12: qty 1

## 2020-12-12 MED ORDER — SIMETHICONE 80 MG PO CHEW
80.0000 mg | CHEWABLE_TABLET | Freq: Three times a day (TID) | ORAL | Status: DC
  Administered 2020-12-12 – 2020-12-14 (×5): 80 mg via ORAL
  Filled 2020-12-12 (×5): qty 1

## 2020-12-12 MED ORDER — DIPHENOXYLATE-ATROPINE 2.5-0.025 MG PO TABS
2.0000 | ORAL_TABLET | Freq: Once | ORAL | Status: AC
  Administered 2020-12-12: 2 via ORAL

## 2020-12-12 MED ORDER — KETOROLAC TROMETHAMINE 30 MG/ML IJ SOLN: 30.0000 mg | Freq: Once | INTRAMUSCULAR | Status: DC | PRN

## 2020-12-12 MED ORDER — DEXMEDETOMIDINE (PRECEDEX) IN NS 20 MCG/5ML (4 MCG/ML) IV SYRINGE
PREFILLED_SYRINGE | INTRAVENOUS | Status: DC | PRN
  Administered 2020-12-12: 8 ug via INTRAVENOUS

## 2020-12-12 MED ORDER — PRENATAL MULTIVITAMIN CH
1.0000 | ORAL_TABLET | Freq: Every day | ORAL | Status: DC
  Administered 2020-12-13 – 2020-12-14 (×2): 1 via ORAL
  Filled 2020-12-12 (×2): qty 1

## 2020-12-12 MED ORDER — FENTANYL CITRATE (PF) 250 MCG/5ML IJ SOLN
INTRAMUSCULAR | Status: AC
  Filled 2020-12-12: qty 5

## 2020-12-12 MED ORDER — MENTHOL 3 MG MT LOZG
1.0000 | LOZENGE | OROMUCOSAL | Status: DC | PRN
  Administered 2020-12-12: 3 mg via ORAL
  Filled 2020-12-12: qty 9

## 2020-12-12 SURGICAL SUPPLY — 36 items
BENZOIN TINCTURE PRP APPL 2/3 (GAUZE/BANDAGES/DRESSINGS) ×2 IMPLANT
CHLORAPREP W/TINT 26 (MISCELLANEOUS) ×2 IMPLANT
CLAMP CORD UMBIL (MISCELLANEOUS) IMPLANT
CLOTH BEACON ORANGE TIMEOUT ST (SAFETY) ×2 IMPLANT
DRAPE C SECTION CLR SCREEN (DRAPES) ×2 IMPLANT
DRSG OPSITE POSTOP 4X10 (GAUZE/BANDAGES/DRESSINGS) ×2 IMPLANT
ELECT REM PT RETURN 9FT ADLT (ELECTROSURGICAL) ×2
ELECTRODE REM PT RTRN 9FT ADLT (ELECTROSURGICAL) ×1 IMPLANT
EXTRACTOR VACUUM KIWI (MISCELLANEOUS) IMPLANT
GLOVE BIO SURGEON STRL SZ7 (GLOVE) ×2 IMPLANT
GLOVE BIOGEL PI IND STRL 7.0 (GLOVE) ×2 IMPLANT
GLOVE BIOGEL PI INDICATOR 7.0 (GLOVE) ×2
GLOVE SURG POLYISO LF SZ6.5 (GLOVE) ×2 IMPLANT
GOWN STRL REUS W/ TWL LRG LVL3 (GOWN DISPOSABLE) ×3 IMPLANT
GOWN STRL REUS W/TWL LRG LVL3 (GOWN DISPOSABLE) ×3
KIT ABG SYR 3ML LUER SLIP (SYRINGE) IMPLANT
NDL HYPO 25X5/8 SAFETYGLIDE (NEEDLE) IMPLANT
NEEDLE HYPO 25X5/8 SAFETYGLIDE (NEEDLE) IMPLANT
NS IRRIG 1000ML POUR BTL (IV SOLUTION) ×2 IMPLANT
PACK C SECTION WH (CUSTOM PROCEDURE TRAY) ×2 IMPLANT
PAD OB MATERNITY 4.3X12.25 (PERSONAL CARE ITEMS) ×2 IMPLANT
PENCIL SMOKE EVAC W/HOLSTER (ELECTROSURGICAL) ×2 IMPLANT
RTRCTR C-SECT PINK 25CM LRG (MISCELLANEOUS) ×2 IMPLANT
STRIP CLOSURE SKIN 1/2X4 (GAUZE/BANDAGES/DRESSINGS) ×2 IMPLANT
SUT MNCRL 0 VIOLET CTX 36 (SUTURE) ×2 IMPLANT
SUT MONOCRYL 0 CTX 36 (SUTURE) ×2
SUT PDS AB 0 CTX 36 PDP370T (SUTURE) ×5 IMPLANT
SUT PLAIN 0 NONE (SUTURE) IMPLANT
SUT VIC AB 2-0 CT1 27 (SUTURE) ×1
SUT VIC AB 2-0 CT1 TAPERPNT 27 (SUTURE) IMPLANT
SUT VIC AB 3-0 CT1 27 (SUTURE) ×2
SUT VIC AB 3-0 CT1 TAPERPNT 27 (SUTURE) IMPLANT
SUT VIC AB 4-0 KS 27 (SUTURE) ×2 IMPLANT
TOWEL OR 17X24 6PK STRL BLUE (TOWEL DISPOSABLE) ×4 IMPLANT
TRAY FOLEY W/BAG SLVR 14FR LF (SET/KITS/TRAYS/PACK) ×2 IMPLANT
WATER STERILE IRR 1000ML POUR (IV SOLUTION) ×2 IMPLANT

## 2020-12-12 NOTE — Progress Notes (Signed)
Called provider to discuss concerns fetal heart rate tachycardia for 8 hours, maternal vital signs, and uterus activity related to Intrauterine Pressure Cathether. Provider aware and stated to redose acetaminophen, provide a fluid bolus, and increase pitocin if FHR becomes more reactive.   Teresa Pelton, [place] 1443 12/12/2020

## 2020-12-12 NOTE — Anesthesia Procedure Notes (Addendum)
Epidural Patient location during procedure: OB Start time: 12/12/2020 6:05 AM End time: 12/12/2020 6:12 AM  Staffing Anesthesiologist: Beryle Lathe, MD Performed: anesthesiologist   Preanesthetic Checklist Completed: patient identified, IV checked, risks and benefits discussed, monitors and equipment checked, pre-op evaluation and timeout performed  Epidural Patient position: sitting Prep: DuraPrep Patient monitoring: continuous pulse ox and blood pressure Approach: midline Location: L2-L3 Injection technique: LOR saline  Needle:  Needle type: Tuohy  Needle gauge: 17 G Needle length: 9 cm Needle insertion depth: 8 cm Catheter size: 19 Gauge Catheter at skin depth: 13 cm Test dose: negative and Other (1% lidocaine)  Assessment Events: blood not aspirated  Additional Notes Patient identified. Risks including, but not limited to, bleeding, infection, nerve damage, paralysis, inadequate analgesia, blood pressure changes, nausea, vomiting, allergic reaction, postpartum back pain, itching, and headache were discussed. Patient expressed understanding and wished to proceed. Sterile prep and drape, including hand hygiene, mask, and sterile gloves were used. The patient was positioned and the spine was prepped. The skin was anesthetized with lidocaine. No paraesthesia or other complication noted. The patient did not experience any signs of intravascular injection such as tinnitus or metallic taste in mouth, nor signs of intrathecal spread such as rapid motor block. Please see nursing notes for vital signs. The patient tolerated the procedure well.   Leslye Peer, MDReason for block:procedure for pain

## 2020-12-12 NOTE — Lactation Note (Signed)
This note was copied from a baby's chart. Lactation Consultation Note  Patient Name: Abigail Berry [id number] Date: 12/12/2020   Age:31 hours  Mom was assisted with latching. Initially, infant was not getting a wide enough latch, but with position change, infant was able to do so. Mom was taught sound of swallows.  Mom was made aware of O/P services, breastfeeding support groups, and our phone # for post-discharge questions.   Lurline Hare [facility] 12/12/2020, 12:40 PM

## 2020-12-12 NOTE — Anesthesia Postprocedure Evaluation (Signed)
Anesthesia Post Note  Patient: Abigail Berry  Procedure(s) Performed: CESAREAN SECTION     Patient location during evaluation: PACU Anesthesia Type: Epidural and General Level of consciousness: awake Pain management: pain level not controlled Vital Signs Assessment: post-procedure vital signs reviewed and stable Respiratory status: spontaneous breathing Cardiovascular status: stable Postop Assessment: no apparent nausea or vomiting Anesthetic complications: no   No notable events documented.  Last Vitals:  Vitals:   12/12/20 1101 12/12/20 1102  BP:    Pulse: (!) 110 (!) 111  Resp: (!) 23 (!) 27  Temp:    SpO2: 92% 93%    Last Pain:  Vitals:   12/12/20 1041  TempSrc:   PainSc: 7    Pain Goal: Patients Stated Pain Goal: 0 (12/10/20 1303)              Epidural/Spinal Function Cutaneous sensation: Normal sensation (12/12/20 1100), Patient able to flex knees: Yes (12/12/20 1100), Patient able to lift hips off bed: Yes (12/12/20 1100), Back pain beyond tenderness at insertion site: No (12/12/20 1100), Progressively worsening motor and/or sensory loss: No (12/12/20 1100), Bowel and/or bladder incontinence post epidural: No (12/12/20 1100)  Caren Macadam

## 2020-12-12 NOTE — Progress Notes (Signed)
Subjective:   Epidural out. Anesthesia in to replace. RN to check pt after placement.   Objective:    VS: BP (!) 152/76   Pulse (!) 113   Temp (!) 100.6 F (38.1 C) (Axillary)   Resp 14   Ht 5\' 5"  (1.651 m)   Wt 93.8 kg   LMP 03/02/2020   SpO2 97%   BMI 34.43 kg/m  FHR : baseline 165 / variability moderate / accelerations present / absent decelerations Toco: contractions every 3-4 minutes /  Membranes: light mec Dilation: 6 Effacement (%): 80 Cervical Position: Middle Station: -2 Presentation: Vertex Exam by:: [email or web address], RN Pitocin 10 mU/min  Assessment/Plan:   30 y.o. G1P0 [redacted]w[redacted]d IOL at term  Labor:  Cytotec x 4 vaginally, 3 doses buccally, Pitocin at 75mu now. AROM at 1255- clear/  Light mec noted now Tachycardia noted 165bpm - Temp 100.6. Tylenol 1000mg  given 9m IVB given @ 0420 (Temp 102.1) Unasyn 3gms x 2 doses C/o ctx pain. Epidural out. New epidural being placed now. Preeclampsia:  no signs or symptoms of toxicity Fetal Wellbeing:  Category II d/t tachycardia Pain Control:  Epidural I/D:   Negative Anticipated MOD:  NSVD Will notify Dr of pt status.   Yannick Steuber MSN, CNM 12/12/2020 6:18 AM

## 2020-12-12 NOTE — Transfer of Care (Signed)
Immediate Anesthesia Transfer of Care Note  Patient: Abigail Berry  Procedure(s) Performed: CESAREAN SECTION  Patient Location: PACU  Anesthesia Type:Epidural  Level of Consciousness: sedated  Airway & Oxygen Therapy: Patient Spontanous Breathing and Patient connected to face mask oxygen  Post-op Assessment: Report given to RN and Post -op Vital signs reviewed and stable  Post vital signs: Reviewed and stable  Last Vitals:  Vitals Value Taken Time  BP 122/69 12/12/20 1000  Temp 37.2 C 12/12/20 0952  Pulse 111 12/12/20 1003  Resp 25 12/12/20 1003  SpO2 99 % 12/12/20 1003  Vitals shown include unvalidated device data.  Last Pain:  Vitals:   12/12/20 0952  TempSrc: Axillary  PainSc:       Patients Stated Pain Goal: 0 (12/10/20 1303)  Complications: No notable events documented.

## 2020-12-12 NOTE — Progress Notes (Signed)
OB Progress Note  In to evaluate cervical change. Overnight, developed chorioamnionitis and started on Unasyn. Maternal fever (Tmax 102.1 at 0418 and Tlast 100.6 at 0543). Treated with Tylenol. Overnight, fetal tachycardia was noted as high as 180 but FHR has been 165-170bpm after treatment of fever and antibiotics. Epidural replaced this morning and patient feels more comfortable.   Unfortunately, cervix remains 6/100/-2. Given chorioamnionitis, persistent Category II fetal heart tracing remote from delivery, recommend proceeding with cesarean delivery. Patient agreeable with plan of care. L&D staff, L&D OR charge, and anesthesia aware. Ancef 2g on call to OR placed.  I have explained to the patient that this surgery is performed to deliver their baby or babies through an incision in the abdomen and incision in the uterus.  Prior to surgery, the risks and benefits of the surgery, as well as alternative treatments were discussed.  The risks include, but are not limited to, possible need for cesarean delivery for all future pregnancies, bleeding at the time of surgery that could necessitate a blood transfusion and/or hysterectomy, rupture of the uterus during a future pregnancy that could cause a preterm delivery and/or requiring hysterectomy, infection, damage to surrounding organs and tissues, damage to bladder, damage to ureters, causing kidney damage, and requiring additional procedures, damage to bowels, resulting in further surgery, postoperative pain, short-term and long-term, scarring on the abdominal wall and intra-abdominally, need for further surgery, development of an incisional hernia, deep vein thrombosis and/or pulmonary embolism, wound infection and/or separation, painful intercourse, urinary leakage, impact on future pregnancies including but not limited to, abnormal location or attachment of the placenta to the uterus, such as placenta previa or accreta, that may necessitate a blood  transfusion and/or hysterectomy, impact on total family size, complications the course of which cannot be predicted or prevented, and death. Patient was consented for blood products.  The patient is aware that bleeding may result in the need for a blood transfusion which includes risk of transmission of HIV (1:2 million), Hepatitis C (1:2 million), and Hepatitis B (1:200 thousand) and transfusion reaction.  Patient voiced understanding of the above risks as well as understanding of indications for blood transfusion.

## 2020-12-12 NOTE — Op Note (Addendum)
Pre Op Dx:   1. Single live IUP at [redacted]w[redacted]d 2. Persistent Category II Fetal Heart Tracing 3. Failure to progress 4. Chorioamnionitis Post Op Dx:  Same as pre-operative diagnoses  Procedure:  Low Transverse Cesarean Section  Surgeon:  Dr. Steva Ready Assistants:  Aldine Contes, CNM Anesthesia:  General  EBL:  269cc  IVF:  1800cc UOP:  150cc  Drains:  Foley catheter  Specimen removed:  Placenta - sent to pathology  Umbilical cord gases collected Device(s) implanted:  None Case Type:  Clean-contaminated Findings: Normal-appearing uterus, bilateral fallopian tubes, and ovaries. Fetus in cephalic position. Moderate meconium and nuchal cord x 1 noted. Complications: None Indications:  30 y.o. G1 at [redacted]w[redacted]d undergoing induction of labor at term. Ruled in for gestational hypertension during induction of labor. Ultimately, developed chorioamnionitis with fetal tachycardia remote from delivery (did not progress past 6cm). Procedure:  After informed consent was obtained, the patient was brought to the operating room.  Following administration of anesthesia, the patient was positioned in dorsal supine position with a leftward tilt and was prepped and draped in sterile fashion.  A preoperative time-out was performed.  The abdomen was entered in layers through a pfannenstiel incision and a retractor was placed.  A low transverse hysterotomy was created sharply to the level of the membranes, then extended bluntly.  The fetus was delivered from cephalic presentation onto the field.  Bulb suctioning was performed.  The cord was doubly clamped and cut after immediately.  The newborn was passed to the warmer.  The placenta was delivered.  The uterus was swept free of clots and debris and closed in a running locked fashion with 0-Monocryl. Due to uterine atony, Hemabate and 1g TXA was administered. A second imbricating layer was used to close the uterus with 0-Monocryl. Two figure-of-eight sutures were placed  on the right side of the hysterotomy for additional hemostasis.  Hemostasis was verified.  The abdomen was irrigated with warmed saline and cleared of clots. The peritoneum was closed in a running fashion with 2-0 Vicryl.  Subfascial spaces were inspected and hemostasis assured.  The fascia was closed in a running fashion with 0-PDS.  The subcutaneous tissues were irrigated and hemostasis assured.  The subcutaneous tissues were closed with 3-0 Monocryl.  The skin was closed with 4-0 Vicryl.  A sterile bandage was applied.  The patient was transferred to PACU.  All needle, sponge, and instrument counts were correct at the end of the case.   Disposition:  PACU  I performed the procedure and the assistant was needed due to the complexity of the anatomy.  Steva Ready, DO

## 2020-12-12 NOTE — Anesthesia Procedure Notes (Signed)
Procedure Name: Intubation Date/Time: 12/12/2020 8:33 AM Performed by: Elgie Congo, CRNA Pre-anesthesia Checklist: Patient identified, Emergency Drugs available, Suction available and Patient being monitored Patient Re-evaluated:Patient Re-evaluated prior to induction Oxygen Delivery Method: Circle system utilized Preoxygenation: Pre-oxygenation with 100% oxygen Induction Type: IV induction Laryngoscope Size: Glidescope Grade View: Grade I Tube type: Oral Tube size: 7.0 mm Number of attempts: 1 Airway Equipment and Method: Rigid stylet Placement Confirmation: ETT inserted through vocal cords under direct vision, positive ETCO2 and breath sounds checked- equal and bilateral Secured at: 22 cm Tube secured with: Tape Dental Injury: Teeth and Oropharynx as per pre-operative assessment

## 2020-12-13 LAB — CBC
HCT: 30.4 % — ABNORMAL LOW (ref 36.0–46.0)
Hemoglobin: 10.2 g/dL — ABNORMAL LOW (ref 12.0–15.0)
MCH: 28.6 pg (ref 26.0–34.0)
MCHC: 33.6 g/dL (ref 30.0–36.0)
MCV: 85.2 fL (ref 80.0–100.0)
Platelets: 206 10*3/uL (ref 150–400)
RBC: 3.57 MIL/uL — ABNORMAL LOW (ref 3.87–5.11)
RDW: 15.3 % (ref 11.5–15.5)
WBC: 11.5 10*3/uL — ABNORMAL HIGH (ref 4.0–10.5)
nRBC: 0 % (ref 0.0–0.2)

## 2020-12-13 NOTE — Progress Notes (Signed)
Postpartum Note Day #  S:  Patient doing well.  Pain controlled.  Tolerating regular diet.   Ambulating and voiding without difficulty. Denies fevers, chills, chest pain, SOB, N/V, or worsening bilateral LE edema.  Lochia: Minimal Infant feeding:  Breast Circumcision:  Desires prior to discharge Contraception:  None  O: Temp:  [97.9 F (36.6 C)-99.4 F (37.4 C)] 97.9 F (36.6 C) (09/02 0529) Pulse Rate:  [78-124] 83 (09/02 0529) Resp:  [16-47] 18 (09/02 0529) BP: (117-146)/(61-95) 125/77 (09/02 0529) SpO2:  [92 %-100 %] 98 % (09/02 0529) Gen: NAD, pleasant and cooperative CV: RRR Resp: CTAB, no wheezes/rales/rhonchi Abdomen: soft, non-distended, non-tender throughout Uterus: firm, non-tender, below umbilicus Incision: c/d/i, bandage in place  Ext: Trace bilateral LE edema, no bilateral calf tenderness, SCDs on and working  Labs:  Recent Labs    12/13/20 0505  HGB 10.2*    A/P: Patient is a 30 y.o. G1P1001 POD#1 s/p LTCS.  S/p LTCS (failure to progress, chorioamnionitis) - Pain well controlled  - GU: UOP is adequate - GI: Tolerating regular diet - Activity: encouraged sitting up to chair and ambulation as tolerated - DVT Prophylaxis: SCDs and frequent ambulation - Labs: stable as above - Antibiotics: Unasyn x 24h postpartum  Gestational HTN - Normotensive postpartum - Preeclampsia labs negative, P/C ratio 0.14 - Will arrange 1 week BP check  Rh negative - Rhogam eval postpartum  Circumcision Consent: Routine circumcisions performed on newborns have been identified as voluntary, elective procedures by [employer] such as the [employer] [facility].  It is considered an elective procedure with no definitive medical indication and carries risks.  Risks include but are not limited to bleeding, infection, damage to penis with possible need for further surgery, poor cosmesis, and local anesthetic risks.  Circumcision will only be performed if  patient is deemed to have normal anatomy by his Pediatrician, meets adequate criteria for a newborn of similar gestational age after birth and is without infection or other medical issue contraindicating an elective procedure.   Patient understands and agrees with above consent Patient discussed with mother of infant  Disposition:  D/C home likely POD#2-3   Steva Ready, DO [phone] (office)

## 2020-12-13 NOTE — Lactation Note (Signed)
This note was copied from a baby's chart. Lactation Consultation Note  Patient Name: Abigail Berry [id number] Date: 12/13/2020 Reason for consult: Follow-up assessment;Primapara;1st time breastfeeding;Term;Maternal endocrine disorder Age:31 hours   P1 mother whose infant is now 76 hours old.  This is a term baby at 40+5 weeks.    Baby was swaddled and in mother's arms when I arrived; small emesis noted and mother was assisting baby to clear emesis.   Due to baby receiving a circumcision, mother requests to begin pumping with the DEBP.  Pump parts, assembly and cleaning reviewed.  #24 flange size is appropriate on the right breast but mother required a #27 flange for the left breast.  Mother denied pain with pumping.    Encouraged mother to continue feeding 8-12 times/24 hours or sooner if baby shows feeding cues.  She will pump as desired after feedings and call for latch assistance as needed.    Mother has a DEBP for home use.  No support person present at this time.  RN updated.   Maternal Data Has patient been taught Hand Expression?: Yes Does the patient have breastfeeding experience prior to this delivery?: No  Feeding Mother's Current Feeding Choice: Breast Milk  LATCH Score                    Lactation Tools Discussed/Used Tools: Pump;Flanges Flange Size: 27;24 Breast pump type: Double-Electric Breast Pump;Manual Pump Education: Setup, frequency, and cleaning;Milk Storage Reason for Pumping: Mother's request Pumping frequency: Every three hours  Interventions    Discharge Pump: DEBP;Manual;Personal WIC Program: No  Consult Status Consult Status: Follow-up Date: 12/14/20 Follow-up type: In-patient    Tareka Jhaveri R Horace Lukas 12/13/2020, 3:26 PM

## 2020-12-14 DIAGNOSIS — O139 Gestational [pregnancy-induced] hypertension without significant proteinuria, unspecified trimester: Secondary | ICD-10-CM | POA: Diagnosis not present

## 2020-12-14 DIAGNOSIS — Z98891 History of uterine scar from previous surgery: Secondary | ICD-10-CM

## 2020-12-14 MED ORDER — NIFEDIPINE ER 30 MG PO TB24: 30.0000 mg | ORAL_TABLET | Freq: Every day | ORAL | 1 refills | Status: AC

## 2020-12-14 MED ORDER — IBUPROFEN 600 MG PO TABS: 600.0000 mg | ORAL_TABLET | Freq: Four times a day (QID) | ORAL | 0 refills | Status: AC

## 2020-12-14 MED ORDER — OXYCODONE HCL 5 MG PO TABS: 5.0000 mg | ORAL_TABLET | ORAL | 0 refills | Status: AC | PRN

## 2020-12-14 NOTE — Discharge Summary (Signed)
PCS OB Discharge Summary [facility] physician patient     Patient Name: Abigail Berry DOB: 1990-05-05 MRN: [id number]  Date of admission: 12/09/2020 Delivering MD: Steva Ready  Date of delivery: 12/12/2020 Type of delivery: PCS  Newborn Data: Sex: Baby  female Circumcision: circ completed Live born female  Birth Weight: 8 lb 2 oz (3685 g) APGAR: 8, 9  Newborn Delivery   Birth date/time: 12/12/2020 08:35:00 Delivery type: C-Section, Low Transverse Trial of labor: Yes C-section categorization: Primary      Feeding: breast Infant being discharge to home with mother in stable condition.   Admitting diagnosis: Encounter for induction of labor [Z34.90] Intrauterine pregnancy: [redacted]w[redacted]d     Secondary diagnosis:  Principal Problem:   Encounter for induction of labor Active Problems:   Normal postpartum course   Gestational hypertension   Status post primary low transverse cesarean section                                Complications: None                                                              Intrapartum Procedures: cesarean: low cervical, transverse Postpartum Procedures: antibiotics and for 24 hours for suspected chorio.  Complications-Operative and Postpartum: none Augmentation: AROM, Pitocin, and Cytotec   History of Present Illness: Ms. Abigail Berry is a 30 y.o. female, G1P1001, who presents at [redacted]w[redacted]d weeks gestation. The patient has been followed at  [facility]  Her pregnancy has been complicated by:  Patient Active Problem List   Diagnosis Date Noted   Normal postpartum course 12/14/2020   Gestational hypertension 12/14/2020   Status post primary low transverse cesarean section 12/14/2020   Encounter for induction of labor 12/06/2020   Chronic diffuse otitis externa of left ear 07/30/2020   Rh negative state in antepartum period 06/07/2020   Anemia in pregnancy 06/07/2020   Encounter for supervision of normal first  pregnancy in first trimester 06/05/2020   Hand dermatitis 06/08/2019   Seborrheic dermatitis of scalp 06/08/2019   Infertility associated with anovulation 04/24/2019   PCOS (polycystic ovarian syndrome) 06/14/2018   Perennial allergic rhinitis with seasonal variation 09/02/2015     Active Ambulatory Problems    Diagnosis Date Noted   Encounter for supervision of normal first pregnancy in first trimester 06/05/2020   Rh negative state in antepartum period 06/07/2020   Anemia in pregnancy 06/07/2020   Chronic diffuse otitis externa of left ear 07/30/2020   Hand dermatitis 06/08/2019   Infertility associated with anovulation 04/24/2019   PCOS (polycystic ovarian syndrome) 06/14/2018   Perennial allergic rhinitis with seasonal variation 09/02/2015   Seborrheic dermatitis of scalp 06/08/2019   Resolved Ambulatory Problems    Diagnosis Date Noted   No Resolved Ambulatory Problems   Past Medical History:  Diagnosis Date   Seasonal allergies      Hospital course:  Induction of Labor With Cesarean Section   30 y.o. yo G1P1001 at [redacted]w[redacted]d was admitted to the hospital 12/09/2020 for induction of labor. Patient had a labor course significant for was admitted for IOL for term and progressed with cyctoec, pitocin and AROM, then suspected chorio. The patient went for cesarean section  due to  FTP and suspected chorio, QBL was 269, hgb stable, ruled in for [facility] during her stay started on procardia and has been normotensive since, PCR was 0.14, other labs unremarkable, RH- but newborn RH- . Delivery details are as follows: Membrane Rupture Time/Date: 12:55 PM ,12/11/2020   Delivery Method:C-Section, Low Transverse  Details of operation can be found in separate operative Note.  Patient had an uncomplicated postpartum course. She is ambulating, tolerating a regular diet, passing flatus, and urinating well.  Patient is discharged home in stable condition on 12/14/20.      Newborn Data: Birth date:12/12/2020   Birth time:8:35 AM  Gender:Female  Living status:Living  Apgars:8 ,9  [id number] g                               [facility] Course--Unscheduled Cesarean:  Admitted 12/06/2020. Negative GBS.  Utilized general anesthesia d/t epidural did not work for pain management.   PCS.  Due to FTP and suspected chorio, she was consented for cesarean, with Dr. Connye Burkitt performing a Primary LTCS under general anesthesia, with delivery of a viable baby female, with weight and Apgars as listed below. Infant was in good condition and remained at the patient's bedside.  The patient was taken to recovery in good condition.  Patient planned to breast feed.  On post-op day 1, patient was doing well, tolerating a regular diet, with Hgb of 10.2.  Throughout her stay, her physical exam was WNL, her incision was CDI, and her vital signs remained stable.  By post-op day 1, she was up ad lib, tolerating a regular diet, with good pain control with po med.  She was deemed to have received the full benefit of her hospital stay, and was discharged home in stable condition.  Contraceptive choice was undecided. Pt meets criteria for every discharge and desires to go home. Pt on procardia for GHTN 30mg  XL, normotensive now, denies HA, RUQ pain or vision changes, 1 week BP check with [facility] physician.   Physical exam  Vitals:   12/13/20 0529 12/13/20 1314 12/13/20 2024 12/14/20 0516  BP: 125/77 121/71 119/72 130/73  Pulse: 83  76 68  Resp: 18 18 18 18   Temp: 97.9 F (36.6 C) 98 F (36.7 C) 98.3 F (36.8 C) 98.2 F (36.8 C)  TempSrc: Oral Oral Oral Oral  SpO2: 98% 97% 98%   Weight:      Height:       General: alert, cooperative, and no distress Lochia: appropriate Uterine Fundus: firm Incision: Healing well with no significant drainage, No significant erythema, Dressing is clean, dry, and intact, honeycomb dressing CDI Perineum: intact DVT Evaluation: No evidence of DVT seen on physical exam. Negative Homan's sign. No  cords or calf tenderness. No significant calf/ankle edema.  Labs: Lab Results  Component Value Date   WBC 11.5 (H) 12/13/2020   HGB 10.2 (L) 12/13/2020   HCT 30.4 (L) 12/13/2020   MCV 85.2 12/13/2020   PLT 206 12/13/2020   CMP Latest Ref Rng & Units 12/09/2020  Glucose 70 - 99 mg/dL 02/12/2021)  BUN 6 - 20 mg/dL 7  Creatinine 12/11/2020 - 585(I mg/dL 7.78  Sodium 2.42 - 3.53 mmol/L 133(L)  Potassium 3.5 - 5.1 mmol/L 3.9  Chloride 98 - 111 mmol/L 105  CO2 22 - 32 mmol/L 20(L)  Calcium 8.9 - 10.3 mg/dL 8.9  Total Protein 6.5 - 8.1 g/dL 6.7  Total Bilirubin  0.3 - 1.2 mg/dL 5.0(T)  Alkaline Phos 38 - 126 U/L 219(H)  AST 15 - 41 U/L 15  ALT 0 - 44 U/L 17    Date of discharge: 12/14/2020 Discharge Diagnoses: Term Pregnancy-delivered and GHTN Discharge instruction: per After Visit Summary and "Baby and Me Booklet".  After visit meds:   Activity:           unrestricted and pelvic rest Advance as tolerated. Pelvic rest for 6 weeks.  Diet:                routine Medications: PNV, Ibuprofen, and procardia 30 mg XL, Oxy IR Postpartum contraception: Undecided Condition:  Pt discharge to home with baby in stable condition GHTN: F/U with [facility] in 1 week , take procardia, report s/sx.   Meds: Allergies as of 12/14/2020   No Known Allergies      Medication List     STOP taking these medications    aspirin EC 81 MG tablet       TAKE these medications    cyclobenzaprine 10 MG tablet Commonly known as: FLEXERIL Take 10 mg by mouth at bedtime as needed for muscle spasms.   ferrous sulfate 325 (65 FE) MG tablet Take 1 tablet (325 mg total) by mouth every other day.   fluticasone 50 MCG/ACT nasal spray Commonly known as: FLONASE Place 1-2 sprays into both nostrils daily as needed for allergies or rhinitis.   ibuprofen 600 MG tablet Commonly known as: ADVIL Take 1 tablet (600 mg total) by mouth every 6 (six) hours.   loratadine 10 MG tablet Commonly known as: CLARITIN Take 10 mg by  mouth daily as needed for allergies.   NIFEdipine 30 MG 24 hr tablet Commonly known as: ADALAT CC Take 1 tablet (30 mg total) by mouth daily.   oxyCODONE 5 MG immediate release tablet Commonly known as: Oxy IR/ROXICODONE Take 1 tablet (5 mg total) by mouth every 4 (four) hours as needed for moderate pain.   prenatal vitamin w/FE, FA 27-1 MG Tabs tablet Take 1 tablet by mouth daily at 12 noon.        Discharge Follow Up:   Follow-up Information     Gynecology, [facility] up in 1 week(s).   Specialty: Obstetrics and Gynecology Why: 1 weeks BP check, 6 weeks PPV. Contact information: [address] [place] [address] [phone]                  [facility], NP-C, CNM 12/14/2020, 10:14 AM  Dale Moberly, FNP

## 2020-12-17 LAB — SURGICAL PATHOLOGY

## 2020-12-26 ENCOUNTER — Telehealth (HOSPITAL_COMMUNITY): Payer: Self-pay | Admitting: *Deleted

## 2020-12-26 NOTE — Telephone Encounter (Signed)
Hospital Discharge Follow-up Call:  Patient reports that she is doing OK.  Today she has had some sharp pain in her incision and she has an incision check appointment at her OB's office tomorrow.  She also says she has had a persistent cough since getting general anesthesia for her cesarean.  Advised her to talk to her doctor about this at her appointment tomorrow.    EPDS today was 11.  Question 10 was "0".  Patient endorses that she is having some emotional struggles right now.  Will notify MD of score and I asked patient to mention this tomorrow at her appointment.  Patient reports that baby is doing well and she has no concerns about baby's health.  Baby as a pediatrician appt tomorrow.  She says that baby sleeps in a bassinet at night and a "lounger" during the day.  ABCs of Safe Sleep reviewed and patient verbalized understanding.

## 2021-02-21 ENCOUNTER — Ambulatory Visit
Admission: RE | Admit: 2021-02-21 | Discharge: 2021-02-21 | Disposition: A | Discharge status: Home / Self Care | Payer: Medicaid Other | Source: Ambulatory Visit | Attending: Physician Assistant | Admitting: Physician Assistant

## 2021-02-21 ENCOUNTER — Other Ambulatory Visit: Payer: Self-pay | Admitting: Physician Assistant

## 2021-02-21 DIAGNOSIS — M5442 Lumbago with sciatica, left side: Secondary | ICD-10-CM

## 2021-02-21 DIAGNOSIS — M5441 Lumbago with sciatica, right side: Secondary | ICD-10-CM

## 2021-08-05 ENCOUNTER — Other Ambulatory Visit: Payer: Self-pay | Admitting: Physician Assistant

## 2021-08-05 DIAGNOSIS — M5441 Lumbago with sciatica, right side: Secondary | ICD-10-CM

## 2021-08-23 ENCOUNTER — Other Ambulatory Visit: Payer: Medicaid Other

## 2021-08-31 ENCOUNTER — Other Ambulatory Visit: Payer: Medicaid Other

## 2021-09-13 ENCOUNTER — Other Ambulatory Visit: Payer: Medicaid Other

## 2021-09-27 ENCOUNTER — Other Ambulatory Visit: Payer: Medicaid Other

## 2021-10-04 ENCOUNTER — Other Ambulatory Visit: Payer: Medicaid Other

## 2021-10-18 ENCOUNTER — Ambulatory Visit
Admission: RE | Admit: 2021-10-18 | Discharge: 2021-10-18 | Disposition: A | Discharge status: Home / Self Care | Payer: Medicaid Other | Source: Ambulatory Visit | Attending: Physician Assistant | Admitting: Physician Assistant

## 2021-10-18 DIAGNOSIS — M5441 Lumbago with sciatica, right side: Secondary | ICD-10-CM

## 2022-06-09 ENCOUNTER — Ambulatory Visit
Admission: RE | Admit: 2022-06-09 | Discharge: 2022-06-09 | Disposition: A | Discharge status: Home / Self Care | Payer: Medicaid Other | Source: Ambulatory Visit | Attending: Physician Assistant | Admitting: Physician Assistant

## 2022-06-09 ENCOUNTER — Other Ambulatory Visit: Payer: Self-pay | Admitting: Physician Assistant

## 2022-06-09 DIAGNOSIS — R058 Other specified cough: Secondary | ICD-10-CM

## 2022-06-09 DIAGNOSIS — R062 Wheezing: Secondary | ICD-10-CM

## 2023-02-15 IMAGING — DX DG PELVIS 1-2V
1 series · 1 of 1 positions shown · non-contrast
Comparison: None.

CLINICAL DATA: Acute low back pain with bilateral sciatica.

EXAM:
PELVIS - 1-2 VIEW

[dg pelvis 1-2 views]
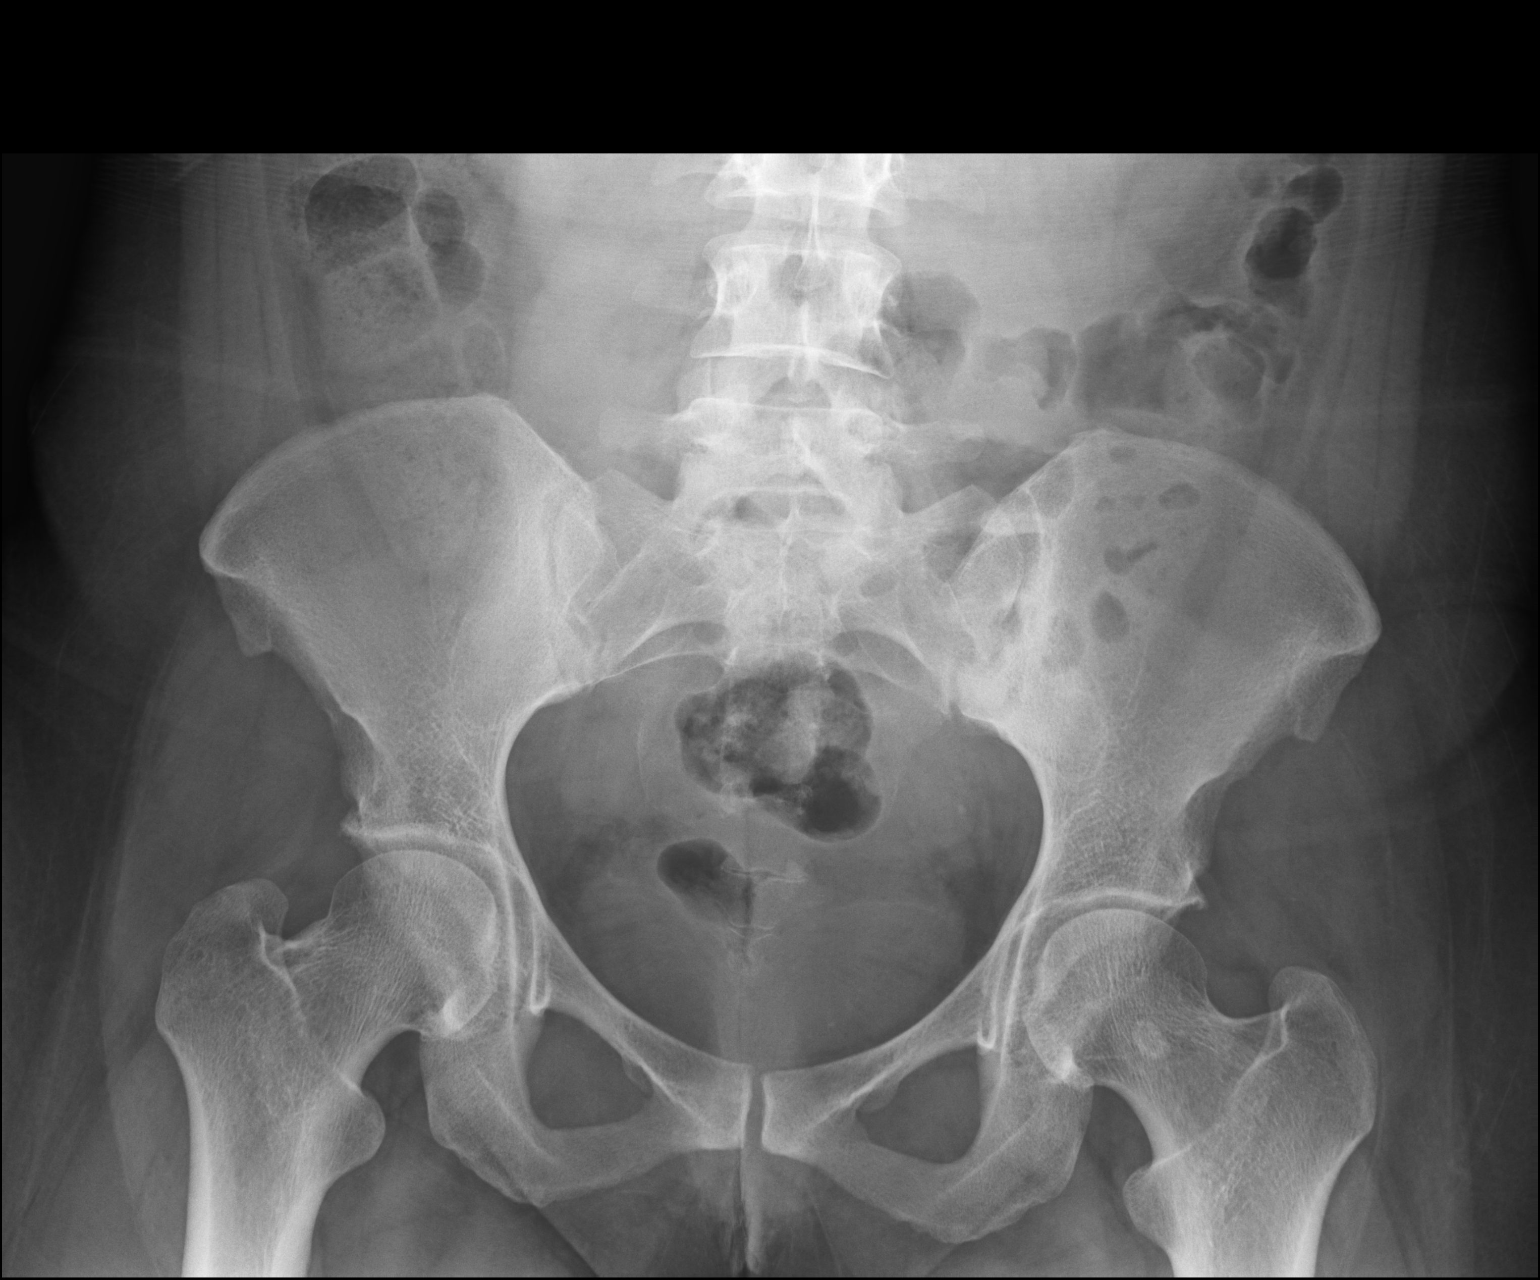

[1 of 1 positions shown; findings below may reference images not displayed]

FINDINGS: There is no evidence of pelvic fracture or diastasis. Joint spaces
are well maintained. There is minimal sclerosis of the left
sacroiliac joint. There is an over sclerotic density measuring 11 mm
in the left humeral neck, favored as a bone island or other benign
lesion. Soft tissues are within normal limits.
IMPRESSION: 1. No acute bony abnormality.
2. Mild sclerosis of the left sacroiliac joint.

## 2023-12-14 NOTE — Progress Notes (Signed)
 " Chief Complaint: Chief Complaint  Patient presents with   Follow-up    Pt states Had PF last year and stretching helped.  Started back in May/June now both feet hurt.  Right worse than left.  Getting worse.     HPI  33 y.o. patient presents today for follow-up evaluation of bilateral plantar fasciitis.  Patient reports persistent symptoms since May/June 2025.  Pain is located to the plantar heel bilateral, right is worse than left.  Patient had this problem previously for which she received bilateral injections last year.  Patient reports compliance with stretching instructions.  Denies use of oral anti-inflammatories or orthotics.  Patient reports increasing exercise regimen recently likely contributing to symptoms.  Patient is interested in injections again today if indicated.  ROS: Constitutional: Negative for activity change, appetite change, chills, fever and unexpected weight change.  HENT: Negative for nosebleeds, trouble swallowing and voice change.   Respiratory: Negative for chest tightness, shortness of breath, wheezing and stridor.   Cardiovascular: Negative for chest pain, palpitations and leg swelling.  Gastrointestinal: Negative for abdominal pain, blood in stool, constipation and diarrhea.  Genitourinary: Negative for flank pain,or  hematuria  VITALS Vitals:   12/14/23 1111  BP: 122/76    Past Medical Hx: Past Medical History:  Diagnosis Date   Acne vulgaris    Eczema    PCOS (polycystic ovarian syndrome)     Diagnosed via history, outside labs and imaging; Menses regular on OCPs. Desires pregnancy Stop OCPs and start daily metformin and prenatal vitamins   Perennial allergic rhinitis with seasonal variation     Current Meds:  Current Outpatient Medications:    clindamycin (CLEOCIN T) 1 % solution, Apply to red acne bumps on the face 1-2 times a day, Disp: 60 mL, Rfl: 3   ergocalciferol (VITAMIN D2) 1,250 mcg (50,000 unit) capsule, Take 50,000 Units  by mouth once a week., Disp: , Rfl:    Finacea 15 % foam, APPLY 1  TOPICALLY TWICE DAILY TO  ACNE, Disp: 50 g, Rfl: 5   loratadine (CLARITIN) 10 mg tablet, Take 10 mg by mouth daily as needed., Disp: , Rfl:    metFORMIN (GLUCOPHAGE) 500 mg tablet, Take 500 mg by mouth nightly., Disp: , Rfl:    minoxidiL 5 % foam, Apply nightly to scalp as directed, Disp: 60 g, Rfl: 5   triamcinolone (KENALOG) 0.1 % cream, Apply to rash on hands 1-2 times daily as needed for rash/itch. Never use on face., Disp: 45 g, Rfl: 3   fluticasone propionate (FLONASE) 50 mcg/spray nasal spray, Administer 1 spray into each nostril Once Daily. (Patient taking differently: Administer 1 spray into each nostril as needed.), Disp: 16 g, Rfl: 3 No current facility-administered medications for this visit.  Allergies: Allergies  Allergen Reactions   Grass Pollen-June Grass Standard Other (See Comments)    Typical Hay Fever Symptoms    Exam: Vascular: Palpable dorsalis pedis pulse right. Palpable dorsalis pedis pulse left. Palpable posterior tibial pulse right . Palpable posterior tibial pulse left. No pallor on elevation or dependent rubor.  Integument: Warm supple skin, positive hair growth on the toes, no signs of ulceration or clubbing.  Neurological:  Intact sensation via light touch  bilateral.  Musculoskeletal: Normal muscle mass and tone symmetric, bilateral.  Moderate pain on palpation to medial calcaneal tuberosity at the insertion of the plantar fascia R >L. No pain on palpation of the abductor hallucis muscle belly .  NegativeTinel's over the tibial nerve .  No  palpable defect noted in the plantar fascia.  Decreased ankle joint dorsiflexion with the knee extended, improves with knee flexion.   Imaging: XR right foot on 08/30/22 shows no acute fracture, forefoot and hindfoot well aligned.  No evidence of plantar calcaneal spurring.  Dx: 1. Plantar fasciitis, bilateral  dexAMETHasone  (DECADRON ) injection 4  mg   dexAMETHasone  (DECADRON ) injection 4 mg       Plan: A focused history and physical exam was performed. Discussed diagnosis with the patient and the treatment options.  Discussed with patient plantar fasciitis in depth, handout given. Recommend good supportive shoe wear and over-the-counter orthotics. Emphasized the importance of continuing daily stretching exercises. Recommend ice therapy (frozen water bottle) and oral anti-inflammatories as needed for pain. She would like to try corticosteroid injections again today, see procedure note.  Discussed progressing to formal physical therapy should symptoms persist.    All questions answered, patient to follow-up as needed.  Procedure: Tendon Sheath Injection: left plantar fascia on 12/14/2023 11:10 AM Indications: pain, diagnostic and therapeutic benefit Details: 25 G needle, medial approach Medications: 4 mg dexAMETHasone  4 mg/mL; 1 mL lidocaine  10 mg/mL (1 %) Outcome: patient tolerated the procedure well with no immediate complications Consent was given by the patient. Immediately prior to procedure a time out was called to verify the correct patient, procedure, equipment, support staff and site/side marked as required. Patient was prepped and draped in the usual sterile fashion.    Tendon Sheath Injection: right plantar fascia on 12/14/2023 11:10 AM Indications: pain, diagnostic and therapeutic benefit Details: 25 G needle, medial approach Medications: 4 mg dexAMETHasone  4 mg/mL; 1 mL lidocaine  10 mg/mL (1 %) Outcome: patient tolerated the procedure well with no immediate complications Procedure, treatment alternatives, risks and benefits explained, specific risks discussed. Consent was given by the patient. Immediately prior to procedure a time out was called to verify the correct patient, procedure, equipment, support staff and site/side marked as required. Patient was prepped and draped in the usual sterile fashion.        Future  Appt.: Scheduled Future Appointments       Provider Department Dept Phone Center   12/13/2024 3:15 PM Ricka Lair [facility] - Dermatology [phone] [facility]       12/14/2023 Electronically signed by:  Marylee Bernardino Crafts, DPM 12/14/2023 11:22 AM  I saw and evaluated the patient and reviewed the resident's note. I agree with the resident's findings and plan.  Electronically signed by: Manuelita Comer Rave, DPM 12/14/2023 11:51 AM     "

## 2024-05-08 ENCOUNTER — Encounter

## 2024-05-11 ENCOUNTER — Encounter

## 2024-05-17 ENCOUNTER — Encounter
